# Patient Record
Sex: Male | Born: 1945
Health system: Southern US, Community
[De-identification: ages and names within clinical notes are randomized; demographics above are authoritative.]

## PROBLEM LIST (undated history)

## (undated) DIAGNOSIS — D126 Benign neoplasm of colon, unspecified: Secondary | ICD-10-CM

## (undated) DIAGNOSIS — Z8042 Family history of malignant neoplasm of prostate: Secondary | ICD-10-CM

## (undated) DIAGNOSIS — J339 Nasal polyp, unspecified: Secondary | ICD-10-CM

## (undated) DIAGNOSIS — E039 Hypothyroidism, unspecified: Secondary | ICD-10-CM

## (undated) DIAGNOSIS — J309 Allergic rhinitis, unspecified: Secondary | ICD-10-CM

## (undated) DIAGNOSIS — R030 Elevated blood-pressure reading, without diagnosis of hypertension: Secondary | ICD-10-CM

## (undated) DIAGNOSIS — J4540 Moderate persistent asthma, uncomplicated: Secondary | ICD-10-CM

## (undated) DIAGNOSIS — N400 Enlarged prostate without lower urinary tract symptoms: Secondary | ICD-10-CM

## (undated) HISTORY — DX: Hypothyroidism, unspecified: E03.9

## (undated) HISTORY — PX: TONSILLECTOMY: SHX5217

## (undated) HISTORY — PX: COLONOSCOPY W/ POLYPECTOMY: SHX1380

## (undated) HISTORY — DX: Allergic rhinitis, unspecified: J30.9

## (undated) HISTORY — DX: Moderate persistent asthma, uncomplicated: J45.40

## (undated) HISTORY — DX: Elevated blood-pressure reading, without diagnosis of hypertension: R03.0

## (undated) HISTORY — DX: Benign neoplasm of colon, unspecified: D12.6

## (undated) HISTORY — DX: Family history of malignant neoplasm of prostate: Z80.42

## (undated) HISTORY — DX: Nasal polyp, unspecified: J33.9

## (undated) HISTORY — DX: Benign prostatic hyperplasia without lower urinary tract symptoms: N40.0

---

## 2006-07-18 ENCOUNTER — Ambulatory Visit: Payer: Self-pay | Admitting: Internal Medicine

## 2006-07-18 LAB — CONVERTED CEMR LAB
Albumin: 4.1 g/dL (ref 3.5–5.2)
Alkaline Phosphatase: 65 units/L (ref 39–117)
BUN: 20 mg/dL (ref 6–23)
Basophils Absolute: 0 10*3/uL (ref 0.0–0.1)
CO2: 29 meq/L (ref 19–32)
Creatinine, Ser: 0.9 mg/dL (ref 0.4–1.5)
GFR calc non Af Amer: 91 mL/min
Glomerular Filtration Rate, Af Am: 110 mL/min/{1.73_m2}
Glucose, Bld: 88 mg/dL (ref 70–99)
HDL: 76.9 mg/dL (ref >39.0)
Hemoglobin: 15.3 g/dL (ref 13.0–17.0)
LDL DIRECT: 65.1 mg/dL
MCHC: 35.8 g/dL (ref 30.0–36.0)
Monocytes Relative: 7.5 % (ref 3.0–11.0)
Neutro Abs: 3.4 10*3/uL (ref 1.4–7.7)
Neutrophils Relative %: 45.3 % (ref 43.0–77.0)
PSA: 0.74 ng/mL (ref 0.10–4.00)
Platelets: 339 10*3/uL (ref 150–400)
Potassium: 3.5 meq/L (ref 3.5–5.1)
RBC: 4.55 M/uL (ref 4.22–5.81)
RDW: 12.6 % (ref 11.5–14.6)
Sodium: 140 meq/L (ref 135–145)
Total Bilirubin: 1.1 mg/dL (ref 0.3–1.2)
Total Protein: 6.9 g/dL (ref 6.0–8.3)
Triglyceride fasting, serum: 182 mg/dL — ABNORMAL HIGH (ref 0–149)
VLDL: 36 mg/dL (ref 0–40)

## 2006-07-25 ENCOUNTER — Ambulatory Visit: Payer: Self-pay | Admitting: Internal Medicine

## 2006-08-21 ENCOUNTER — Ambulatory Visit: Payer: Self-pay | Admitting: Internal Medicine

## 2006-08-21 LAB — CONVERTED CEMR LAB
Triglycerides: 57 mg/dL (ref 0–149)
VLDL: 11 mg/dL (ref 0–40)

## 2006-09-05 ENCOUNTER — Ambulatory Visit: Payer: Self-pay | Admitting: Internal Medicine

## 2007-03-12 DIAGNOSIS — J309 Allergic rhinitis, unspecified: Secondary | ICD-10-CM | POA: Insufficient documentation

## 2007-03-12 DIAGNOSIS — J45909 Unspecified asthma, uncomplicated: Secondary | ICD-10-CM | POA: Insufficient documentation

## 2007-06-18 ENCOUNTER — Encounter: Payer: Self-pay | Admitting: Internal Medicine

## 2007-08-11 ENCOUNTER — Encounter: Payer: Self-pay | Admitting: Internal Medicine

## 2008-01-13 ENCOUNTER — Ambulatory Visit: Payer: Self-pay | Admitting: Internal Medicine

## 2008-01-13 DIAGNOSIS — E039 Hypothyroidism, unspecified: Secondary | ICD-10-CM

## 2008-01-13 LAB — CONVERTED CEMR LAB
Free T4: 1.4 ng/dL (ref 0.6–1.6)
T3, Free: 3.7 pg/mL (ref 2.3–4.2)

## 2008-03-01 ENCOUNTER — Ambulatory Visit: Payer: Self-pay | Admitting: Gastroenterology

## 2008-03-24 ENCOUNTER — Encounter: Payer: Self-pay | Admitting: Internal Medicine

## 2008-04-07 ENCOUNTER — Ambulatory Visit: Payer: Self-pay | Admitting: Gastroenterology

## 2008-04-07 ENCOUNTER — Encounter: Payer: Self-pay | Admitting: Gastroenterology

## 2008-04-12 ENCOUNTER — Encounter: Payer: Self-pay | Admitting: Gastroenterology

## 2008-06-23 ENCOUNTER — Encounter: Payer: Self-pay | Admitting: Internal Medicine

## 2009-02-24 ENCOUNTER — Emergency Department (HOSPITAL_COMMUNITY): Admission: EM | Admit: 2009-02-24 | Discharge: 2009-02-24 | Payer: Self-pay | Admitting: Emergency Medicine

## 2009-03-04 ENCOUNTER — Encounter (INDEPENDENT_AMBULATORY_CARE_PROVIDER_SITE_OTHER): Payer: Self-pay | Admitting: *Deleted

## 2009-03-09 ENCOUNTER — Ambulatory Visit: Payer: Self-pay | Admitting: Internal Medicine

## 2009-03-09 LAB — CONVERTED CEMR LAB
ALT: 11 units/L (ref 0–53)
AST: 13 units/L (ref 0–37)
Albumin: 3.7 g/dL (ref 3.5–5.2)
Bilirubin Urine: NEGATIVE
Chloride: 105 meq/L (ref 96–112)
Eosinophils Relative: 5.1 % — ABNORMAL HIGH (ref 0.0–5.0)
GFR calc non Af Amer: 103.04 mL/min (ref >60)
Glucose, Bld: 83 mg/dL (ref 70–99)
Glucose, Urine, Semiquant: NEGATIVE
HCT: 41.5 % (ref 39.0–52.0)
Hemoglobin: 14.4 g/dL (ref 13.0–17.0)
LDL Cholesterol: 74 mg/dL (ref 0–99)
Lymphs Abs: 3 10*3/uL (ref 0.7–4.0)
MCV: 93.6 fL (ref 78.0–100.0)
Monocytes Absolute: 0.4 10*3/uL (ref 0.1–1.0)
Monocytes Relative: 6.5 % (ref 3.0–12.0)
Neutro Abs: 2.9 10*3/uL (ref 1.4–7.7)
Potassium: 4 meq/L (ref 3.5–5.1)
Protein, U semiquant: NEGATIVE
Sodium: 140 meq/L (ref 135–145)
TSH: 0.03 microintl units/mL — ABNORMAL LOW (ref 0.35–5.50)
Total Protein: 6.7 g/dL (ref 6.0–8.3)
VLDL: 28.8 mg/dL (ref 0.0–40.0)
WBC Urine, dipstick: NEGATIVE
WBC: 6.7 10*3/uL (ref 4.5–10.5)
pH: 7

## 2009-04-01 ENCOUNTER — Ambulatory Visit: Payer: Self-pay | Admitting: Internal Medicine

## 2009-04-01 DIAGNOSIS — N63 Unspecified lump in unspecified breast: Secondary | ICD-10-CM

## 2009-04-06 ENCOUNTER — Encounter: Payer: Self-pay | Admitting: Internal Medicine

## 2009-07-27 ENCOUNTER — Telehealth: Payer: Self-pay | Admitting: Gastroenterology

## 2009-09-15 ENCOUNTER — Encounter (INDEPENDENT_AMBULATORY_CARE_PROVIDER_SITE_OTHER): Payer: Self-pay | Admitting: *Deleted

## 2009-09-16 ENCOUNTER — Encounter: Payer: Self-pay | Admitting: Internal Medicine

## 2009-09-21 ENCOUNTER — Ambulatory Visit: Payer: Self-pay | Admitting: Gastroenterology

## 2009-10-04 ENCOUNTER — Ambulatory Visit: Payer: Self-pay | Admitting: Gastroenterology

## 2009-10-07 ENCOUNTER — Encounter: Payer: Self-pay | Admitting: Gastroenterology

## 2010-02-21 IMAGING — CT CT HEAD W/O CM
2 series · 16 of 30 positions shown, 20 images · non-contrast
Comparison: None

CLINICAL DATA: Weakness, altered level of consciousness

CT HEAD WITHOUT CONTRAST
TECHNIQUE: Contiguous axial images were obtained from the base of
the skull through the vertex without contrast.

[Series 2: brain · axial · 0.47mm/px · z∈[+158,+284]mm · 13 of 44 slices shown, 17 images]
[im 4/44  brain]
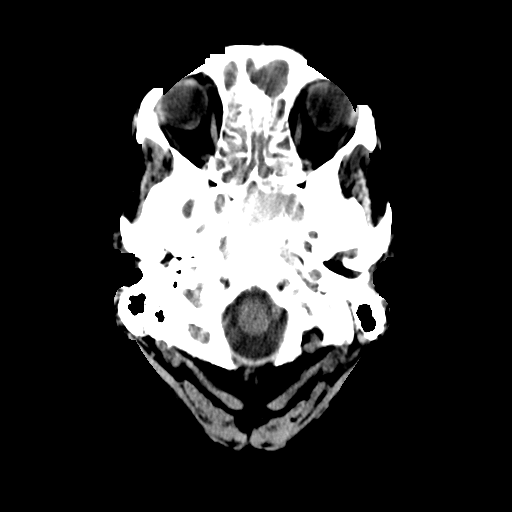
[im 4/44  bone]
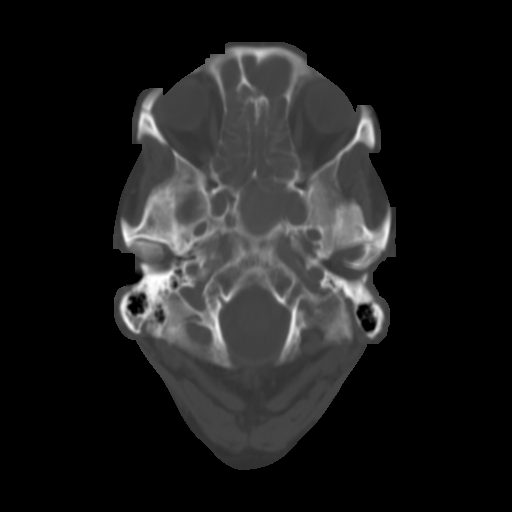
[im 7/44  brain]
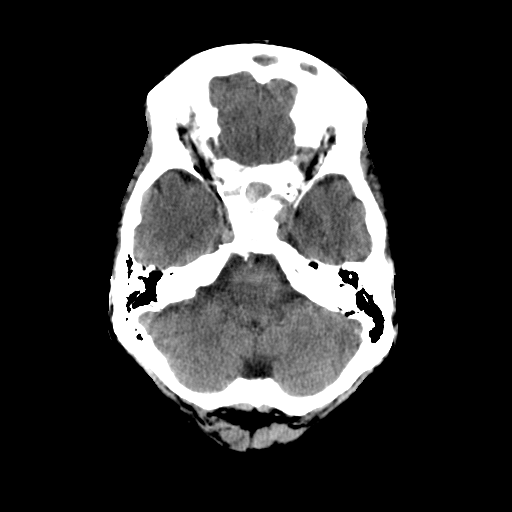
[im 10/44  brain]
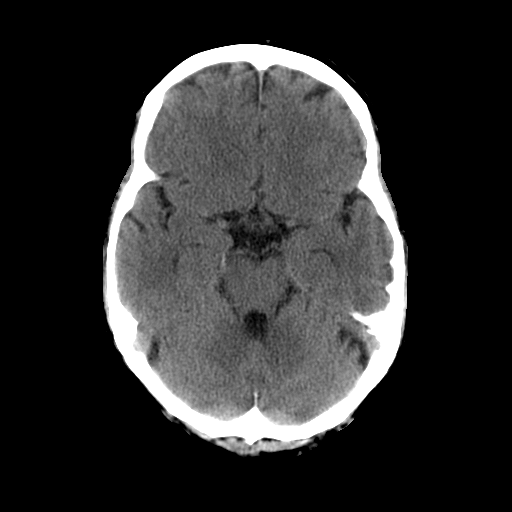
[im 13/44  brain]
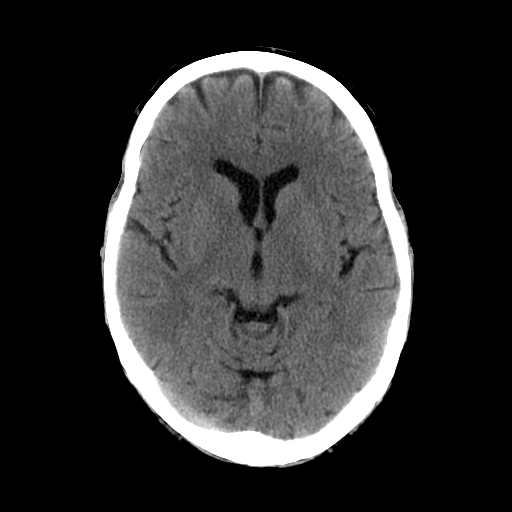
[im 16/44  brain]
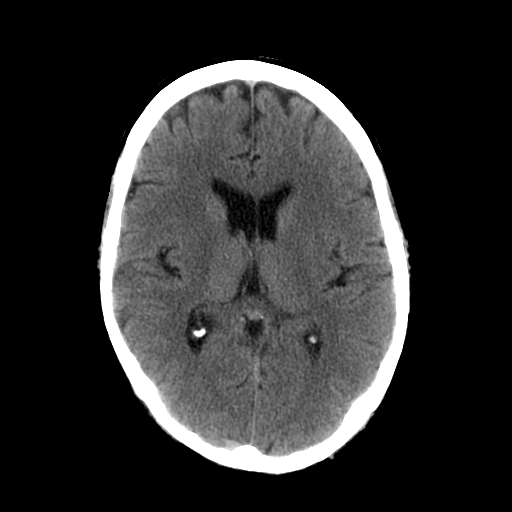
[im 16/44  bone]
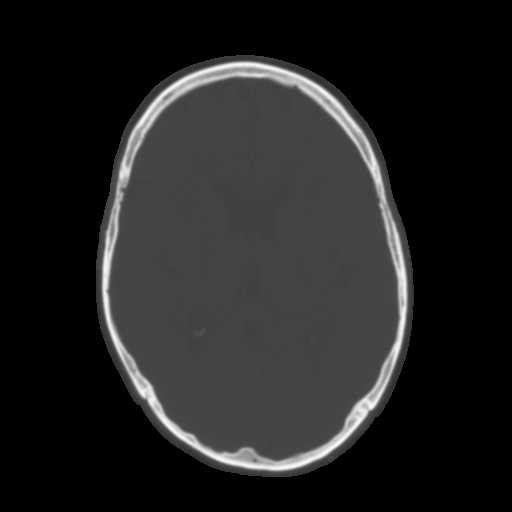
[im 19/44  brain]
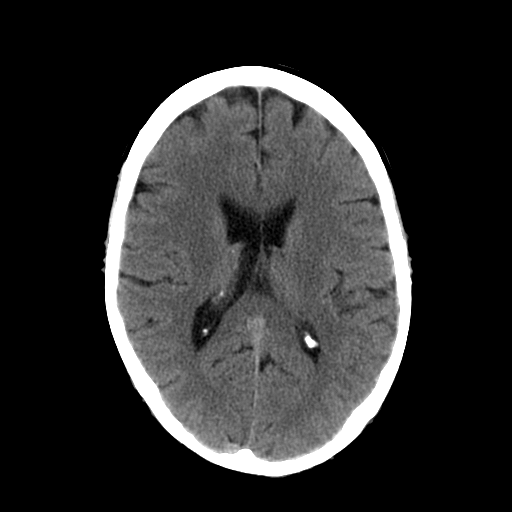
[im 22/44  brain]
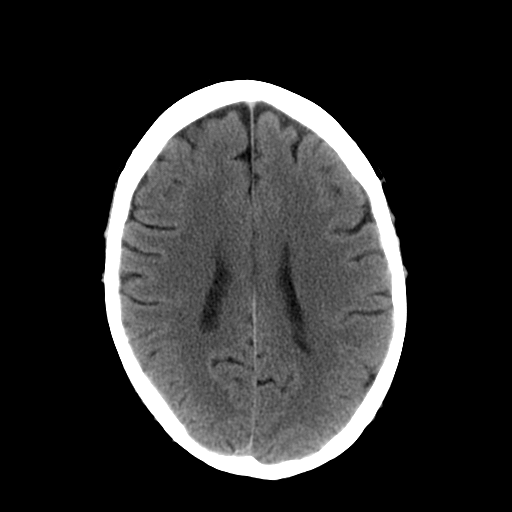
[im 25/44  brain]
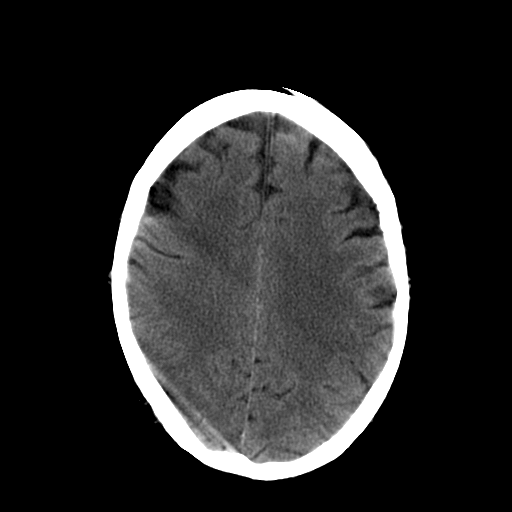
[im 28/44  brain]
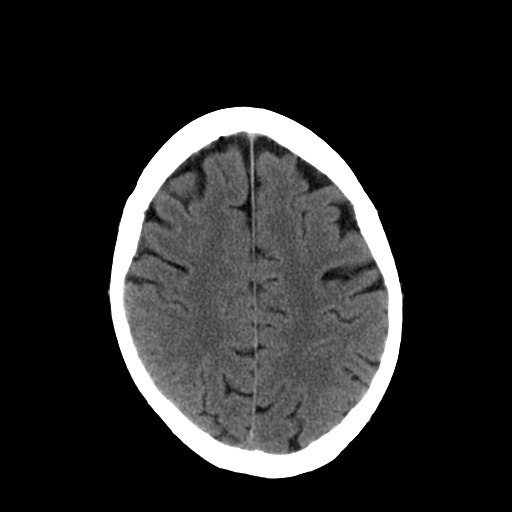
[im 28/44  bone]
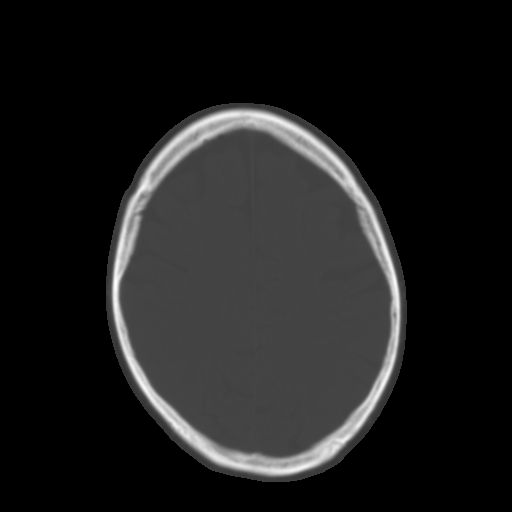
[im 31/44  brain]
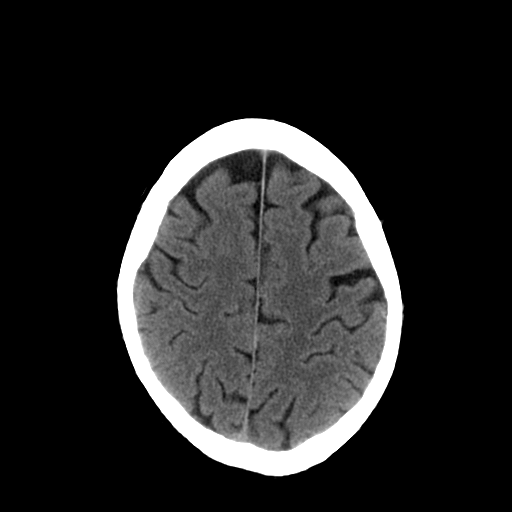
[im 34/44  brain]
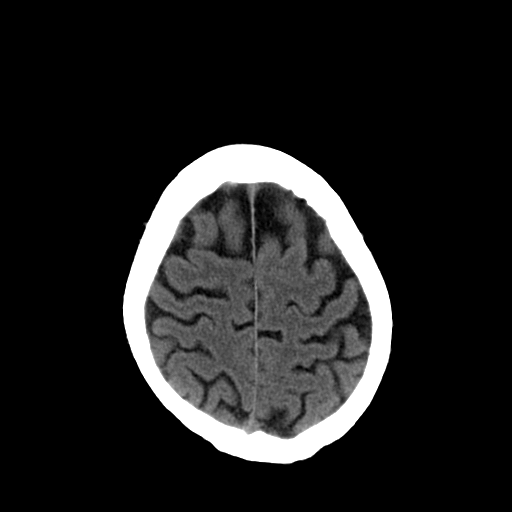
[im 37/44  brain]
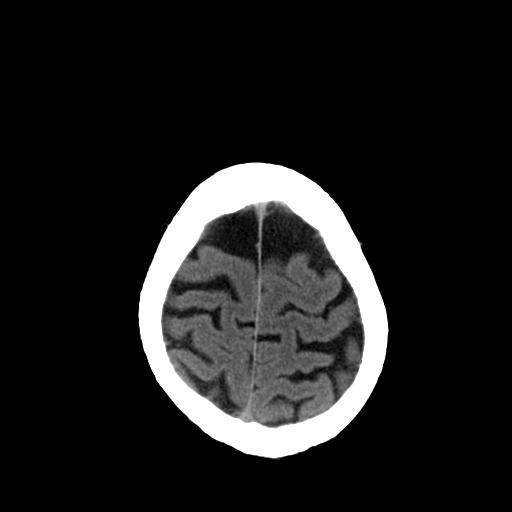
[im 40/44  brain]
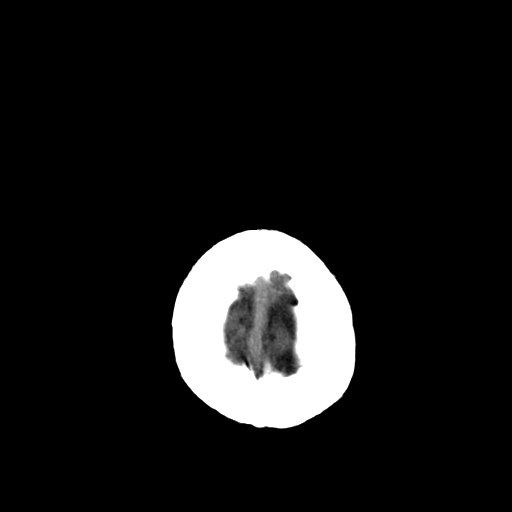
[im 40/44  bone]
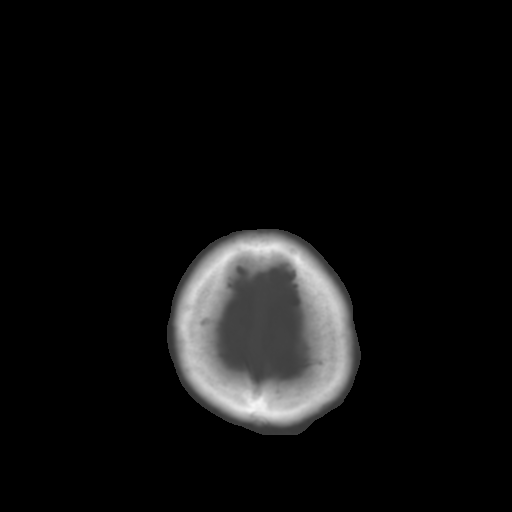

[Series 3: recon 2: brain · axial · 0.47mm/px · z∈[+158,+216]mm · 3 of 44 slices shown]
[im 4/44  brain]
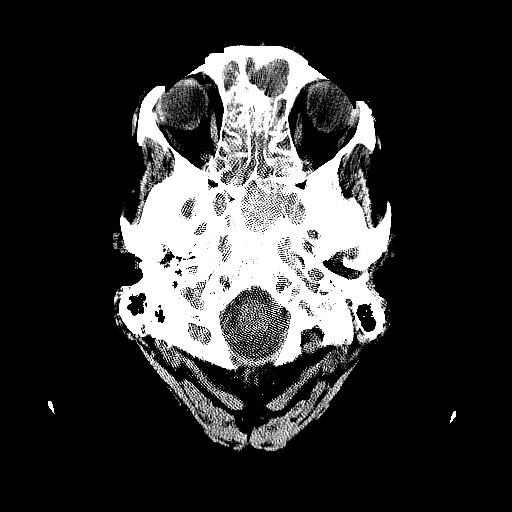
[im 10/44  brain]
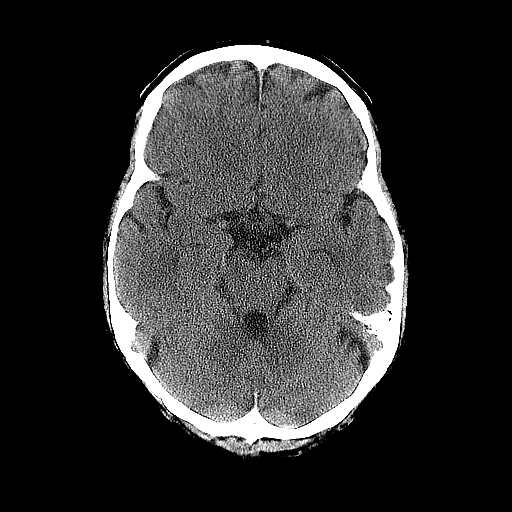
[im 16/44  brain]
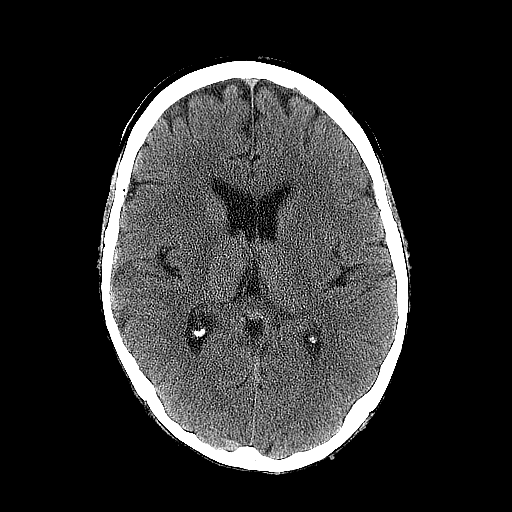

[16 of 30 positions shown; findings below may reference images not displayed]

FINDINGS: Motion artifact is noted. No acute hemorrhage, acute
infarction, or mass lesion is identified.  No midline shift.  No
ventriculomegaly.  Dense opacification of the frontal, ethmoid, and
sphenoid sinuses is noted.  Partial left maxillary sinus
opacification noted.  Orbits are intact.  No skull fracture.
IMPRESSION: No acute intracranial finding.

Sinusitis.

## 2010-08-15 NOTE — Miscellaneous (Signed)
Summary: [facility]  Clinical Lists Changes  Medications: Added new medication of MOVIPREP 100 GM  SOLR (PEG-KCL-NACL-NASULF-NA ASC-C) As per prep instructions. - Signed Rx of MOVIPREP 100 GM  SOLR (PEG-KCL-NACL-NASULF-NA ASC-C) As per prep instructions.;  #1 x 0;  Signed;  Entered by: Ezra Sites RN;  Authorized by: Mardella Layman MD [facility];  Method used: Electronically to [facility]*, [address] [place], [place]  [address], Ph: [phone] or [phone], Fax: [phone] Observations: Added new observation of ALLERGY REV: Done (09/21/2009 13:18)    Prescriptions: MOVIPREP 100 GM  SOLR (PEG-KCL-NACL-NASULF-NA ASC-C) As per prep instructions.  #1 x 0   Entered by:   Ezra Sites RN   Authorized by:   Mardella Layman MD [facility]   Signed by:   Ezra Sites RN on 09/21/2009   Method used:   Electronically to        [facility]* (retail)       [address]       [place], [place]  [address]       Ph: [phone] or [phone]       Fax: [phone]   RxID:   [id number]

## 2010-08-15 NOTE — Letter (Signed)
Summary: Moviprep Instructions  [facility]  [address]   [place], [place] [address]   Phone: [phone]  Fax: [phone]       Benjamin Johns    September 21, 1945    MRN: [id number]        Procedure Day /Date: Tuesday 10-04-09     Arrival Time: 12:30 p.m.     Procedure Time: 1:30 p.m.     Location of Procedure:                    x   [facility] (4th Floor)   PREPARATION FOR COLONOSCOPY WITH MOVIPREP   Starting 5 days prior to your procedure  Thursday, 09-29-09 do not eat nuts, seeds, popcorn, corn, beans, peas,  salads, or any raw vegetables.  Do not take any fiber supplements (e.g. Metamucil, Citrucel, and Benefiber).  THE DAY BEFORE YOUR PROCEDURE         DATE: 10-03-09   DAY: Monday  1.  Drink clear liquids the entire day-NO SOLID FOOD  2.  Do not drink anything colored red or purple.  Avoid juices with pulp.  No orange juice.  3.  Drink at least 64 oz. (8 glasses) of fluid/clear liquids during the day to prevent dehydration and help the prep work efficiently.  CLEAR LIQUIDS INCLUDE: Water Jello Ice Popsicles Tea (sugar ok, no milk/cream) Powdered fruit flavored drinks Coffee (sugar ok, no milk/cream) Gatorade Juice: apple, white grape, white cranberry  Lemonade Clear bullion, consomm, broth Carbonated beverages (any kind) Strained chicken noodle soup Hard Candy                             4.  In the morning, mix first dose of MoviPrep solution:    Empty 1 Pouch A and 1 Pouch B into the disposable container    Add lukewarm drinking water to the top line of the container. Mix to dissolve    Refrigerate (mixed solution should be used within 24 hrs)  5.  Begin drinking the prep at 5:00 p.m. The MoviPrep container is divided by 4 marks.   Every 15 minutes drink the solution down to the next mark (approximately 8 oz) until the full liter is complete.   6.  Follow completed prep with 16 oz of clear liquid of your choice (Nothing red or  purple).  Continue to drink clear liquids until bedtime.  7.  Before going to bed, mix second dose of MoviPrep solution:    Empty 1 Pouch A and 1 Pouch B into the disposable container    Add lukewarm drinking water to the top line of the container. Mix to dissolve    Refrigerate  THE DAY OF YOUR PROCEDURE      DATE: 10-04-09   DAY: Tuesday  Beginning at 8:30 a.m. (5 hours before procedure):         1. Every 15 minutes, drink the solution down to the next mark (approx 8 oz) until the full liter is complete.  2. Follow completed prep with 16 oz. of clear liquid of your choice.    3. You may drink clear liquids until  11:30 a.m. (2 HOURS BEFORE PROCEDURE).   MEDICATION INSTRUCTIONS  Unless otherwise instructed, you should take regular prescription medications with a small sip of water   as early as possible the morning of your procedure.           OTHER INSTRUCTIONS  You will need a responsible adult at least 67 years of age to accompany you and drive you home.   This person must remain in the waiting room during your procedure.  Wear loose fitting clothing that is easily removed.  Leave jewelry and other valuables at home.  However, you may wish to bring a book to read or  an iPod/MP3 player to listen to music as you wait for your procedure to start.  Remove all body piercing jewelry and leave at home.  Total time from sign-in until discharge is approximately 2-3 hours.  You should go home directly after your procedure and rest.  You can resume normal activities the  day after your procedure.  The day of your procedure you should not:   Drive   Make legal decisions   Operate machinery   Drink alcohol   Return to work  You will receive specific instructions about eating, activities and medications before you leave.    The above instructions have been reviewed and explained to me by  Ezra Sites RN  September 21, 2009 1:38 PM      I fully understand and  can verbalize these instructions _____________________________ Date _________

## 2010-08-15 NOTE — Letter (Signed)
Summary: Patient Notice- Polyp Results  [facility]  [address] [place], [place] [address]   Phone: [phone]  Fax: [phone]        October 07, 2009 MRN: [id number]    WILMOT QUEVEDO [address] [place], [place]  [address]    Dear Mr. FERRON,  I am pleased to inform you that the colon polyp(s) removed during your recent colonoscopy was (were) found to be benign (no cancer detected) upon pathologic examination.  I recommend you have a repeat colonoscopy examination in 3_ years to look for recurrent polyps, as having colon polyps increases your risk for having recurrent polyps or even colon cancer in the future.  Should you develop new or worsening symptoms of abdominal pain, bowel habit changes or bleeding from the rectum or bowels, please schedule an evaluation with either your primary care physician or with me.  Additional information/recommendations:  _xx_ No further action with gastroenterology is needed at this time. Please      follow-up with your primary care physician for your other healthcare      needs.  __ Please call [phone] to schedule a return visit to review your      situation.  __ Please keep your follow-up visit as already scheduled.  __ Continue treatment plan as outlined the day of your exam.  Please call us if you are having persistent problems or have questions about your condition that have not been fully answered at this time.  Sincerely,  Mardella Layman MD [facility]  This letter has been electronically signed by your physician.  Appended Document: Patient Notice- Polyp Results letter mailed 3.28.11

## 2010-08-15 NOTE — Progress Notes (Signed)
Summary: Schedule Colonoscopy  Phone Note Outgoing Call Call back at [facility] Phone [phone]   Call placed by: Harlow Mares CMA Duncan Dull),  July 27, 2009 8:42 AM Call placed to: Patient Summary of Call: Left message on patients machine to call back.  Initial call taken by: Harlow Mares CMA Duncan Dull),  July 27, 2009 8:43 AM  Follow-up for Phone Call        patietn will call back to schedule his colonoscopy while he in the states the next couple of weeks.  Follow-up by: Harlow Mares CMA Duncan Dull),  August 10, 2009 9:08 AM

## 2010-08-15 NOTE — Procedures (Signed)
Summary: Colonoscopy  Patient: Benjamin Johns Note: All result statuses are Final unless otherwise noted.  Tests: (1) Colonoscopy (COL)   COL Colonoscopy           DONE     [facility]     [address]     [place], [place]  [address]           COLONOSCOPY PROCEDURE REPORT           PATIENT:  [name], [name]  MR#:  [id number]     BIRTHDATE:  13-Jun-1946, 63 yrs. old  GENDER:  male           ENDOSCOPIST:  Vania Rea. Jarold Motto, MD, [facility]     Referred by:           PROCEDURE DATE:  10/04/2009     PROCEDURE:  Colonoscopy with snare polypectomy     ASA CLASS:  Class II     INDICATIONS:  history of pre-cancerous (adenomatous) colon polyps                 MEDICATIONS:   Fentanyl 50 mcg IV, Versed 6 mg IV           DESCRIPTION OF PROCEDURE:   After the risks benefits and     alternatives of the procedure were thoroughly explained, informed     consent was obtained.  Digital rectal exam was performed and     revealed no abnormalities.   The LB CF-[id number] endoscope     was introduced through the anus and advanced to the cecum, which     was identified by both the appendix and ileocecal valve, without     limitations.  The quality of the prep was excellent, using     MoviPrep.  The instrument was then slowly withdrawn as the colon     was fully examined.     <<PROCEDUREIMAGES>>           FINDINGS:  Scattered diverticula were found in the ascending     colon.  There were multiple polyps identified and removed. found     scattered throught the colon. 3-5MM FLAT POLYPS HOT SNARE     EXCISED.LEFT AND RIGHT COLON.  This was otherwise a normal     examination of the colon.   Retroflexed views in the rectum     revealed no abnormalities.    The scope was then withdrawn from     the patient and the procedure completed.           COMPLICATIONS:  None           ENDOSCOPIC IMPRESSION:     1) Diverticula, scattered in the ascending colon     2) Polyps, multiple found scattered  throught the colon     3) Otherwise normal examination     RECOMMENDATIONS:     1) If the polyp(s) removed today are proven to be adenomatous     (pre-cancerous) polyps, you will need a colonoscopy in 3 years.     Otherwise you should continue to follow colorectal cancer     screening guidelines for "routine risk" patients with a     colonoscopy in 10 years.           REPEAT EXAM:  No           ______________________________     Vania Rea. Jarold Motto, MD, [facility]           CC:  Balinda Quails  Lovell Sheehan, MD           n.     Rosalie DoctorVania Rea. Patterson at 10/04/2009 02:11 PM           Katina Dung, [phone]  Note: An exclamation mark (!) indicates a result that was not dispersed into the flowsheet. Document Creation Date: 10/04/2009 2:11 PM _______________________________________________________________________  (1) Order result status: Final Collection or observation date-time: 10/04/2009 14:05 Requested date-time:  Receipt date-time:  Reported date-time:  Referring Physician:   Ordering Physician: Sheryn Bison [phone]) Specimen Source:  Source: Launa Grill Order Number: [id number] Lab site:   Appended Document: Colonoscopy 3y f/u

## 2010-08-15 NOTE — Letter (Signed)
Summary: [facility]  [facility]   Imported By: Maryln Gottron 10/13/2009 12:28:24  _____________________________________________________________________  External Attachment:    Type:   Image     Comment:   External Document

## 2010-10-22 LAB — BASIC METABOLIC PANEL
BUN: 21 mg/dL (ref 6–23)
Calcium: 8.8 mg/dL (ref 8.4–10.5)
Creatinine, Ser: 0.81 mg/dL (ref 0.4–1.5)
GFR calc non Af Amer: >60 mL/min (ref >60)
Glucose, Bld: 138 mg/dL — ABNORMAL HIGH (ref 70–99)
Potassium: 3.7 mEq/L (ref 3.5–5.1)

## 2010-10-22 LAB — PROTIME-INR: Prothrombin Time: 11.8 seconds (ref 11.6–15.2)

## 2010-10-22 LAB — CBC
HCT: 40.3 % (ref 39.0–52.0)
Platelets: 260 10*3/uL (ref 150–400)
RDW: 13 % (ref 11.5–15.5)

## 2010-10-22 LAB — DIFFERENTIAL
Basophils Absolute: 0 10*3/uL (ref 0.0–0.1)
Eosinophils Relative: 5 % (ref 0–5)
Lymphocytes Relative: 45 % (ref 12–46)
Neutrophils Relative %: 45 % (ref 43–77)

## 2011-06-28 ENCOUNTER — Other Ambulatory Visit: Payer: Self-pay | Admitting: Internal Medicine

## 2011-06-28 MED ORDER — LEVOTHYROXINE SODIUM 125 MCG PO TABS
125.0000 ug | ORAL_TABLET | Freq: Every day | ORAL | Status: DC
Start: 2011-06-28 — End: 2011-09-12

## 2011-06-28 NOTE — Telephone Encounter (Signed)
SYNTHROID 125 MCG  TABS (LEVOTHYROXINE SODIUM) Take 1 tablet by mouth once a day 3 month supply to [facility] in [place] [phone].  Pt has sch and ov for cpx in Feb 2013.

## 2011-09-12 ENCOUNTER — Encounter: Payer: Self-pay | Admitting: Internal Medicine

## 2011-09-12 ENCOUNTER — Ambulatory Visit (INDEPENDENT_AMBULATORY_CARE_PROVIDER_SITE_OTHER): Payer: Medicare Other | Admitting: Internal Medicine

## 2011-09-12 DIAGNOSIS — Z0000 Encounter for general adult medical examination without abnormal findings: Secondary | ICD-10-CM | POA: Diagnosis not present

## 2011-09-12 DIAGNOSIS — E785 Hyperlipidemia, unspecified: Secondary | ICD-10-CM | POA: Diagnosis not present

## 2011-09-12 DIAGNOSIS — E039 Hypothyroidism, unspecified: Secondary | ICD-10-CM

## 2011-09-12 DIAGNOSIS — N400 Enlarged prostate without lower urinary tract symptoms: Secondary | ICD-10-CM | POA: Diagnosis not present

## 2011-09-12 DIAGNOSIS — N138 Other obstructive and reflux uropathy: Secondary | ICD-10-CM

## 2011-09-12 DIAGNOSIS — N401 Enlarged prostate with lower urinary tract symptoms: Secondary | ICD-10-CM

## 2011-09-12 DIAGNOSIS — T887XXA Unspecified adverse effect of drug or medicament, initial encounter: Secondary | ICD-10-CM | POA: Diagnosis not present

## 2011-09-12 DIAGNOSIS — Z8042 Family history of malignant neoplasm of prostate: Secondary | ICD-10-CM

## 2011-09-12 DIAGNOSIS — B351 Tinea unguium: Secondary | ICD-10-CM

## 2011-09-12 LAB — HEPATIC FUNCTION PANEL
ALT: 17 U/L (ref 0–53)
Total Bilirubin: 1.2 mg/dL (ref 0.3–1.2)
Total Protein: 6.8 g/dL (ref 6.0–8.3)

## 2011-09-12 LAB — POCT URINALYSIS DIPSTICK
Leukocytes, UA: NEGATIVE
Nitrite, UA: NEGATIVE
Protein, UA: NEGATIVE
pH, UA: 6

## 2011-09-12 LAB — CBC WITH DIFFERENTIAL/PLATELET
Basophils Absolute: 0.1 10*3/uL (ref 0.0–0.1)
Eosinophils Absolute: 0.1 10*3/uL (ref 0.0–0.7)
HCT: 43.9 % (ref 39.0–52.0)
Lymphs Abs: 3.4 10*3/uL (ref 0.7–4.0)
MCV: 93.9 fl (ref 78.0–100.0)
Monocytes Absolute: 0.7 10*3/uL (ref 0.1–1.0)
Platelets: 331 10*3/uL (ref 150.0–400.0)
RDW: 13.8 % (ref 11.5–14.6)

## 2011-09-12 LAB — TSH: TSH: 0.09 u[IU]/mL — ABNORMAL LOW (ref 0.35–5.50)

## 2011-09-12 LAB — LIPID PANEL
Cholesterol: 206 mg/dL — ABNORMAL HIGH (ref 0–200)
Total CHOL/HDL Ratio: 3
Triglycerides: 174 mg/dL — ABNORMAL HIGH (ref 0.0–149.0)

## 2011-09-12 LAB — BASIC METABOLIC PANEL
Calcium: 9.8 mg/dL (ref 8.4–10.5)
Creatinine, Ser: 0.9 mg/dL (ref 0.4–1.5)

## 2011-09-12 LAB — PSA: PSA: 0.93 ng/mL (ref 0.10–4.00)

## 2011-09-12 MED ORDER — LEVOTHYROXINE SODIUM 125 MCG PO TABS: 125.0000 ug | ORAL_TABLET | Freq: Every day | ORAL | Status: DC

## 2011-09-12 MED ORDER — CIPROFLOXACIN HCL 500 MG PO TABS: 500.0000 mg | ORAL_TABLET | Freq: Two times a day (BID) | ORAL | Status: AC

## 2011-09-12 MED ORDER — TERBINAFINE HCL 250 MG PO TABS: 250.0000 mg | ORAL_TABLET | Freq: Every day | ORAL | Status: DC

## 2011-09-12 NOTE — Patient Instructions (Addendum)
The patient is instructed to continue all medications as prescribed. Schedule followup with check out clerk upon leaving the clinic One cup of white vinegar and 1 gallon of warm water soak for 15 minutes 2-3 times a week

## 2011-09-12 NOTE — Progress Notes (Signed)
Subjective:    Patient ID: Benjamin Johns, male    DOB: [birth year], 66 y.o.   MRN: [id number]  HPI Patient presents for a Medicare wellness examination but also presents to follow up for chronic medical problems of hypothyroidism asthma and allergic rhinitis. He has an acute problem of increased nocturia and a sense of urgency in that when he has to go to the restroom if he does not void soon he feels that he would be incontinent   Review of Systems  Constitutional: Negative for fever and fatigue.  HENT: Negative for hearing loss, congestion, neck pain and postnasal drip.   Eyes: Negative for discharge, redness and visual disturbance.  Respiratory: Negative for cough, shortness of breath and wheezing.   Cardiovascular: Negative for leg swelling.  Gastrointestinal: Negative for abdominal pain, constipation and abdominal distention.  Genitourinary: Negative for urgency and frequency.  Musculoskeletal: Negative for joint swelling and arthralgias.  Skin: Negative for color change and rash.  Neurological: Negative for weakness and light-headedness.  Hematological: Negative for adenopathy.  Psychiatric/Behavioral: Negative for behavioral problems.       Objective:   Physical Exam  Constitutional: He is oriented to person, place, and time. He appears well-developed and well-nourished.  HENT:  Head: Normocephalic and atraumatic.  Eyes: Conjunctivae are normal. Pupils are equal, round, and reactive to light.  Neck: Normal range of motion. Neck supple.  Cardiovascular: Normal rate and regular rhythm.   Pulmonary/Chest: Effort normal and breath sounds normal.  Abdominal: Soft. Bowel sounds are normal.  Genitourinary: Rectum normal and prostate normal.  Neurological: He is alert and oriented to person, place, and time.  Skin: Skin is warm and dry.   enlarged prostate 2+ enlarged        Assessment & Plan:  Discussed benign prostatic hypertrophy and will treat him with an antibiotic twice  daily for 2 weeks.  If his symptoms do not resolve will refer to urology for further evaluation.  I suspect his PSA would be slightly elevated due to the inflammation will monitor her PSA today.  Otherwise his blood pressure is stable at TSH T3-T4 free monitor today for his hypothyroidism his asthma is stable as well as his allergic rhinitis Subjective:    Benjamin Johns is a 66 y.o. male who presents for Medicare Initial preventive examination.   Preventive Screening-Counseling & Management  Tobacco History  Smoking status  . Never Smoker   Smokeless tobacco  . Not on file    Problems Prior to Visit 1.   Current Problems (verified) Patient Active Problem List  Diagnoses  . HYPOTHYROIDISM  . ALLERGIC RHINITIS  . ASTHMA  . BREAST MASS, RIGHT    Medications Prior to Visit No current outpatient prescriptions on file prior to visit.    Current Medications (verified) Current Outpatient Prescriptions  Medication Sig Dispense Refill  . levothyroxine (SYNTHROID) 125 MCG tablet Take 1 tablet (125 mcg total) by mouth daily.  90 tablet  3  . ciprofloxacin (CIPRO) 500 MG tablet Take 1 tablet (500 mg total) by mouth 2 (two) times daily.  28 tablet  0  . terbinafine (LAMISIL) 250 MG tablet Take 1 tablet (250 mg total) by mouth daily.  90 tablet  0     Allergies (verified) JXB:JYNWGNFAOZH+YQMVHQION+GEXBMWUXLK acid+aspartame   PAST HISTORY  Family History Family History  Problem Relation Age of Onset  . Dementia Mother   . Prostate cancer Father   . Hypothyroidism Brother     Social History History  Substance  Use Topics  . Smoking status: Never Smoker   . Smokeless tobacco: Not on file  . Alcohol Use: Yes    Are there smokers in your home (other than you)?  No  Risk Factors Current exercise habits: Gym/ health club routine includes cardio, light weights and treadmill.  Dietary issues discussed:    Cardiac risk factors: advanced age (older than 13 for men, 11 for  women) and male gender.  Depression Screen (Note: if answer to either of the following is "Yes", a more complete depression screening is indicated)   Q1: Over the past two weeks, have you felt down, depressed or hopeless? No  Q2: Over the past two weeks, have you felt little interest or pleasure in doing things? No  Have you lost interest or pleasure in daily life? No  Do you often feel hopeless? No  Do you cry easily over simple problems? No  Activities of Daily Living In your present state of health, do you have any difficulty performing the following activities?:  Driving? No Managing money?  No Feeding yourself? No Getting from bed to chair? No Climbing a flight of stairs? No Preparing food and eating?: No Bathing or showering? No Getting dressed: No Getting to the toilet? No Using the toilet:No Moving around from place to place: No In the past year have you fallen or had a near fall?:No   Are you sexually active?  Yes  Do you have more than one partner?  No  Hearing Difficulties: No Do you often ask people to speak up or repeat themselves? No Do you experience ringing or noises in your ears? No Do you have difficulty understanding soft or whispered voices? No   Do you feel that you have a problem with memory? No  Do you often misplace items? No  Do you feel safe at home?  No  Cognitive Testing  Alert? Yes  Normal Appearance?Yes  Oriented to person? Yes  Place? Yes   Time? Yes  Recall of three objects?  Yes  Can perform simple calculations? Yes  Displays appropriate judgment?Yes  Can read the correct time from a watch face?Yes   Advanced Directives have been discussed with the patient? Yes   List the Names of Other Physician/Practitioners you currently use: 1.    Indicate any recent Medical Services you may have received from other than [facility] providers in the past year (date may be approximate).  Immunization History  Administered Date(s) Administered  . Td  07/16/2005    Screening Tests Health Maintenance  Topic Date Due  . Colonoscopy  12/17/1993  . Zostavax  12/18/2003  . Pneumococcal Polysaccharide Vaccine Age 76 And Over  12/17/2008  . Influenza Vaccine  04/16/2011  . Tetanus/tdap  07/17/2015    All answers were reviewed with the patient and necessary referrals were made:  Carrie Mew, MD   09/13/2011   History reviewed: allergies, current medications, past family history, past medical history, past social history, past surgical history and problem list  Review of Systems A comprehensive review of systems was negative.    Objective:     Vision by Snellen chart: right eye:20/20, left eye:20/20 Blood pressure 124/80, pulse 80, temperature 98.3 F (36.8 C), resp. rate 16, height 6\' 2"  (1.88 m), weight 162 lb (73.483 kg). Body mass index is 20.80 kg/(m^2).  BP 124/80  Pulse 80  Temp 98.3 F (36.8 C)  Resp 16  Ht 6\' 2"  (1.88 m)  Wt 162 lb (73.483 kg)  BMI  20.80 kg/m2  General Appearance:    Alert, cooperative, no distress, appears stated age  Head:    Normocephalic, without obvious abnormality, atraumatic  Eyes:    PERRL, conjunctiva/corneas clear, EOM's intact, fundi    benign, both eyes       Ears:    Normal TM's and external ear canals, both ears  Nose:   Nares normal, septum midline, mucosa normal, no drainage    or sinus tenderness  Throat:   Lips, mucosa, and tongue normal; teeth and gums normal  Neck:   Supple, symmetrical, trachea midline, no adenopathy;       thyroid:  No enlargement/tenderness/nodules; no carotid   bruit or JVD  Back:     Symmetric, no curvature, ROM normal, no CVA tenderness  Lungs:     Clear to auscultation bilaterally, respirations unlabored  Chest wall:    No tenderness or deformity  Heart:    Regular rate and rhythm, S1 and S2 normal, no murmur, rub   or gallop  Abdomen:     Soft, non-tender, bowel sounds active all four quadrants,    no masses, no organomegaly  Genitalia:    Normal  male without lesion, discharge or tenderness  Rectal:    Normal tone, normal prostate, no masses or tenderness;   guaiac negative stool  Extremities:   Extremities normal, atraumatic, no cyanosis or edema  Pulses:   2+ and symmetric all extremities  Skin:   Skin color, texture, turgor normal, no rashes or lesions  Lymph nodes:   Cervical, supraclavicular, and axillary nodes normal  Neurologic:   CNII-XII intact. Normal strength, sensation and reflexes      throughout       Assessment:      Patient presents for yearly preventative medicine examination.   all immunizations and health maintenance protocols were reviewed with the patient and they are up to date with these protocols.   screening laboratory values were reviewed with the patient including screening of hyperlipidemia PSA renal function and hepatic function.   There medications past medical history social history problem list and allergies were reviewed in detail.   Goals were established with regard to weight loss exercise diet in compliance with medications      Plan:     During the course of the visit the patient was educated and counseled about appropriate screening and preventive services including:    Influenza vaccine  Td vaccine  Colorectal cancer screening  Diet review for nutrition referral? Yes ____  Not Indicated __x__   Patient Instructions (the written plan) was given to the patient.  Medicare Attestation I have personally reviewed: The patient's medical and social history Their use of alcohol, tobacco or illicit drugs Their current medications and supplements The patient's functional ability including ADLs,fall risks, home safety risks, cognitive, and hearing and visual impairment Diet and physical activities Evidence for depression or mood disorders  The patient's weight, height, BMI, and visual acuity have been recorded in the chart.  I have made referrals, counseling, and provided  education to the patient based on review of the above and I have provided the patient with a written personalized care plan for preventive services.     Carrie Mew, MD   09/13/2011

## 2011-09-17 NOTE — Progress Notes (Signed)
Pt informed an d states he eats a lot of spaghetti

## 2011-11-12 DIAGNOSIS — H6990 Unspecified Eustachian tube disorder, unspecified ear: Secondary | ICD-10-CM | POA: Diagnosis not present

## 2011-11-12 DIAGNOSIS — J330 Polyp of nasal cavity: Secondary | ICD-10-CM | POA: Diagnosis not present

## 2011-11-12 DIAGNOSIS — R05 Cough: Secondary | ICD-10-CM | POA: Diagnosis not present

## 2011-11-12 DIAGNOSIS — H6980 Other specified disorders of Eustachian tube, unspecified ear: Secondary | ICD-10-CM | POA: Diagnosis not present

## 2011-11-12 DIAGNOSIS — J45909 Unspecified asthma, uncomplicated: Secondary | ICD-10-CM | POA: Diagnosis not present

## 2011-11-13 DIAGNOSIS — J45909 Unspecified asthma, uncomplicated: Secondary | ICD-10-CM | POA: Diagnosis not present

## 2011-11-19 DIAGNOSIS — H6990 Unspecified Eustachian tube disorder, unspecified ear: Secondary | ICD-10-CM | POA: Diagnosis not present

## 2011-11-19 DIAGNOSIS — R05 Cough: Secondary | ICD-10-CM | POA: Diagnosis not present

## 2011-11-19 DIAGNOSIS — H6980 Other specified disorders of Eustachian tube, unspecified ear: Secondary | ICD-10-CM | POA: Diagnosis not present

## 2011-11-19 DIAGNOSIS — J45909 Unspecified asthma, uncomplicated: Secondary | ICD-10-CM | POA: Diagnosis not present

## 2012-04-10 ENCOUNTER — Ambulatory Visit (INDEPENDENT_AMBULATORY_CARE_PROVIDER_SITE_OTHER): Payer: Medicare Other | Admitting: Internal Medicine

## 2012-04-10 ENCOUNTER — Encounter: Payer: Self-pay | Admitting: Internal Medicine

## 2012-04-10 VITALS — BP 130/78 | HR 76 | Temp 98.2°F | Resp 16 | Ht 74.0 in | Wt 162.0 lb

## 2012-04-10 DIAGNOSIS — N63 Unspecified lump in unspecified breast: Secondary | ICD-10-CM | POA: Diagnosis not present

## 2012-04-10 DIAGNOSIS — N139 Obstructive and reflux uropathy, unspecified: Secondary | ICD-10-CM | POA: Diagnosis not present

## 2012-04-10 DIAGNOSIS — N401 Enlarged prostate with lower urinary tract symptoms: Secondary | ICD-10-CM | POA: Insufficient documentation

## 2012-04-10 DIAGNOSIS — E039 Hypothyroidism, unspecified: Secondary | ICD-10-CM

## 2012-04-10 DIAGNOSIS — N138 Other obstructive and reflux uropathy: Secondary | ICD-10-CM | POA: Insufficient documentation

## 2012-04-10 MED ORDER — DUTASTERIDE-TAMSULOSIN HCL 0.5-0.4 MG PO CAPS: 1.0000 | ORAL_CAPSULE | Freq: Every day | ORAL | Status: DC

## 2012-04-10 NOTE — Patient Instructions (Signed)
The patient is instructed to continue all medications as prescribed. Schedule followup with check out clerk upon leaving the clinic  

## 2012-04-10 NOTE — Progress Notes (Signed)
Subjective:    Patient ID: Benjamin Johns, male    DOB: [birth year], 66 y.o.   MRN: [id number]  HPI This is a 66 year old man with a history of right breast tenderness now resolved.  Is also followed for hypothyroidism and a history of asthma.  Today he presents for recheck of not dysuria with a relatively normal sized prostate and status post treatment with an antibiotic that dated no change.  He states that he has at least 2-3 episodes of nocturia at night and this is affecting rest and lifestyle   Review of Systems  Constitutional: Negative for fever and fatigue.  HENT: Negative for hearing loss, congestion, neck pain and postnasal drip.   Eyes: Negative for discharge, redness and visual disturbance.  Respiratory: Negative for cough, shortness of breath and wheezing.   Cardiovascular: Negative for leg swelling.  Gastrointestinal: Negative for abdominal pain, constipation and abdominal distention.  Genitourinary: Negative for frequency.       Increased nocturia  Musculoskeletal: Negative for joint swelling and arthralgias.  Skin: Negative for color change and rash.  Neurological: Negative for weakness and light-headedness.  Hematological: Negative for adenopathy.  Psychiatric/Behavioral: Negative for behavioral problems.   Past Medical History  Diagnosis Date  . Allergy   . Asthma   . Thyroid disease     History   Social History  . Marital Status: Married    Spouse Name: N/A    Number of Children: N/A  . Years of Education: N/A   Occupational History  . Not on file.   Social History Main Topics  . Smoking status: Never Smoker   . Smokeless tobacco: Not on file  . Alcohol Use: Yes  . Drug Use: No  . Sexually Active: Not on file   Other Topics Concern  . Not on file   Social History Narrative  . No narrative on file    No past surgical history on file.  Family History  Problem Relation Age of Onset  . Dementia Mother   . Prostate cancer Father   .  Hypothyroidism Brother     Allergies  Allergen Reactions  . Amoxicillin-Pot Clavulanate     REACTION: hives    Current Outpatient Prescriptions on File Prior to Visit  Medication Sig Dispense Refill  . levothyroxine (SYNTHROID) 125 MCG tablet Take 1 tablet (125 mcg total) by mouth daily.  90 tablet  3    BP 130/78  Pulse 76  Temp 98.2 F (36.8 C)  Resp 16  Ht 6\' 2"  (1.88 m)  Wt 162 lb (73.483 kg)  BMI 20.80 kg/m2       Objective:   Physical Exam  Constitutional: He appears well-developed and well-nourished.  HENT:  Head: Normocephalic and atraumatic.  Eyes: Conjunctivae normal are normal. Pupils are equal, round, and reactive to light.  Neck: Normal range of motion. Neck supple.  Cardiovascular: Normal rate and regular rhythm.   Pulmonary/Chest: Effort normal and breath sounds normal.  Abdominal: Soft. Bowel sounds are normal.          Assessment & Plan:  Stable hypothyroidism.  Resolved breast fullness.  Stable asthma.  Persistent nocturia with relatively normal mass of prostate.  We will evaluate a free and total PSA if his PSA has a velocity increasing he'll be referred to urology because of family history of prostate cancer.  He is given samples of a combination drug for prostate hypertrophy he will follow back in 2 months time to see the effect of this  it has no effect on this medication be referred to urology for consideration for TURP because symptomology

## 2012-04-11 ENCOUNTER — Ambulatory Visit: Payer: Medicare Other | Admitting: Internal Medicine

## 2012-04-11 LAB — PSA, TOTAL AND FREE: PSA, Free Pct: 30 % (ref >25)

## 2012-05-01 DIAGNOSIS — R05 Cough: Secondary | ICD-10-CM | POA: Diagnosis not present

## 2012-07-17 DIAGNOSIS — R05 Cough: Secondary | ICD-10-CM | POA: Diagnosis not present

## 2012-08-08 ENCOUNTER — Encounter: Payer: Self-pay | Admitting: Gastroenterology

## 2012-10-08 ENCOUNTER — Other Ambulatory Visit: Payer: Self-pay | Admitting: Internal Medicine

## 2012-12-22 ENCOUNTER — Other Ambulatory Visit: Payer: Self-pay | Admitting: Internal Medicine

## 2013-05-19 ENCOUNTER — Encounter: Payer: Self-pay | Admitting: Gastroenterology

## 2013-06-03 DIAGNOSIS — R05 Cough: Secondary | ICD-10-CM | POA: Diagnosis not present

## 2013-06-03 DIAGNOSIS — J342 Deviated nasal septum: Secondary | ICD-10-CM | POA: Diagnosis not present

## 2013-07-07 DIAGNOSIS — J019 Acute sinusitis, unspecified: Secondary | ICD-10-CM | POA: Diagnosis not present

## 2013-10-07 ENCOUNTER — Encounter: Payer: Self-pay | Admitting: Internal Medicine

## 2013-10-20 ENCOUNTER — Ambulatory Visit (AMBULATORY_SURGERY_CENTER): Payer: Self-pay | Admitting: *Deleted

## 2013-10-20 VITALS — Ht 74.0 in | Wt 163.4 lb

## 2013-10-20 DIAGNOSIS — Z86010 Personal history of colonic polyps: Secondary | ICD-10-CM

## 2013-10-20 MED ORDER — MOVIPREP 100 G PO SOLR: 1.0000 | Freq: Once | ORAL | Status: DC

## 2013-10-20 NOTE — Progress Notes (Signed)
Denies allergies to eggs or soy products. Denies complications with sedation or anesthesia. 

## 2013-10-28 DIAGNOSIS — J331 Polypoid sinus degeneration: Secondary | ICD-10-CM | POA: Diagnosis not present

## 2013-10-28 DIAGNOSIS — R439 Unspecified disturbances of smell and taste: Secondary | ICD-10-CM | POA: Diagnosis not present

## 2013-10-28 DIAGNOSIS — J309 Allergic rhinitis, unspecified: Secondary | ICD-10-CM | POA: Diagnosis not present

## 2013-10-30 ENCOUNTER — Telehealth: Payer: Self-pay | Admitting: Internal Medicine

## 2013-10-30 NOTE — Telephone Encounter (Signed)
No charge, but please facilitate rescheduling

## 2013-11-02 ENCOUNTER — Encounter: Payer: Medicare Other | Admitting: Internal Medicine

## 2013-11-05 DIAGNOSIS — J331 Polypoid sinus degeneration: Secondary | ICD-10-CM | POA: Diagnosis not present

## 2013-11-06 ENCOUNTER — Encounter: Payer: Self-pay | Admitting: Internal Medicine

## 2013-11-06 NOTE — Telephone Encounter (Signed)
I called him but was not available on the phone today so I sent him a letter reminding him to reschedule Colonoscopy appt./yesi

## 2013-11-27 DIAGNOSIS — J45909 Unspecified asthma, uncomplicated: Secondary | ICD-10-CM | POA: Diagnosis not present

## 2013-12-23 DIAGNOSIS — J45909 Unspecified asthma, uncomplicated: Secondary | ICD-10-CM | POA: Diagnosis not present

## 2013-12-28 ENCOUNTER — Encounter: Payer: Self-pay | Admitting: Internal Medicine

## 2014-01-10 ENCOUNTER — Other Ambulatory Visit: Payer: Self-pay | Admitting: Internal Medicine

## 2014-02-22 ENCOUNTER — Ambulatory Visit (AMBULATORY_SURGERY_CENTER): Payer: Self-pay

## 2014-02-22 VITALS — Ht 74.0 in | Wt 168.0 lb

## 2014-02-22 DIAGNOSIS — Z860100 Personal history of colon polyps, unspecified: Secondary | ICD-10-CM

## 2014-02-22 NOTE — Progress Notes (Signed)
No allergies to eggs or soy No past problems with anesthesia No home oxygen No diet/weight loss meds  Has email  Emmi instructions given for colonoscopy 

## 2014-03-01 ENCOUNTER — Ambulatory Visit (AMBULATORY_SURGERY_CENTER): Payer: Medicare Other | Admitting: Internal Medicine

## 2014-03-01 ENCOUNTER — Encounter: Payer: Self-pay | Admitting: Internal Medicine

## 2014-03-01 VITALS — BP 129/89 | HR 71 | Temp 97.0°F | Resp 19 | Ht 74.0 in | Wt 168.0 lb

## 2014-03-01 DIAGNOSIS — Z86010 Personal history of colonic polyps: Secondary | ICD-10-CM | POA: Diagnosis not present

## 2014-03-01 DIAGNOSIS — D126 Benign neoplasm of colon, unspecified: Secondary | ICD-10-CM

## 2014-03-01 DIAGNOSIS — J45909 Unspecified asthma, uncomplicated: Secondary | ICD-10-CM | POA: Diagnosis not present

## 2014-03-01 DIAGNOSIS — E039 Hypothyroidism, unspecified: Secondary | ICD-10-CM | POA: Diagnosis not present

## 2014-03-01 MED ORDER — SODIUM CHLORIDE 0.9 % IV SOLN: 500.0000 mL | INTRAVENOUS | Status: DC

## 2014-03-01 NOTE — Op Note (Signed)
[place]  [address] [place], [id number]   COLONOSCOPY PROCEDURE REPORT  PATIENT: Benjamin Johns, Benjamin Johns  MR#: [id number] BIRTHDATE: [birth year] , 61  yrs. old GENDER: Male ENDOSCOPIST: Jerene Bears, MD PROCEDURE DATE:  03/01/2014 PROCEDURE:   Colonoscopy with cold biopsy polypectomy First Screening Colonoscopy - Avg.  risk and is 50 yrs.  old or older - No.  Prior Negative Screening - Now for repeat screening. N/A  History of Adenoma - Now for follow-up colonoscopy & has been > or = to 3 yrs.  Yes hx of adenoma.  Has been 3 or more years since last colonoscopy.  Polyps Removed Today? Yes. ASA CLASS:   Class II INDICATIONS:elevated risk screening, Patient's personal history of adenomatous colon polyps, and Last colonoscopy performed 2011 (Dr. Sharlett Iles). MEDICATIONS: MAC sedation, administered by CRNA and propofol (Diprivan) 350mg  IV  DESCRIPTION OF PROCEDURE:   After the risks benefits and alternatives of the procedure were thoroughly explained, informed consent was obtained.  A digital rectal exam revealed no rectal mass.   The LB [id number]  endoscope was introduced through the anus and advanced to the cecum, which was identified by both the appendix and ileocecal valve. No adverse events experienced. The quality of the prep was good, using MoviPrep  The instrument was then slowly withdrawn as the colon was fully examined.   COLON FINDINGS: Six sessile polyps ranging between 3-55mm in size were found in the ascending colon, at the hepatic flexure, in the descending colon, sigmoid colon, and rectum (2).  Polypectomy was performed with cold forceps.  All resections were complete and all polyp tissue was completely retrieved.   The colon mucosa was otherwise normal.  Retroflexed views revealed internal hemorrhoids. The time to cecum=3 minutes 23 seconds.  Withdrawal time=13 minutes 37 seconds.  The scope was withdrawn and the procedure  completed. COMPLICATIONS: There were no complications.  ENDOSCOPIC IMPRESSION: 1.   Six sessile polyps ranging between 3-33mm in size were found in the ascending colon, at the hepatic flexure, in the descending colon, sigmoid colon, and rectum; Polypectomy was performed with cold forceps 2.   The colon mucosa was otherwise normal  RECOMMENDATIONS: 1.  Await pathology results 2.  Timing of repeat colonoscopy will be determined by pathology findings. 3.  You will receive a letter within 1-2 weeks with the results of your biopsy as well as final recommendations.  Please call my office if you have not received a letter after 3 weeks.   eSigned:  Jerene Bears, MD 03/01/2014 2:21 PM      cc: The Patient and Ricard Dillon, MD

## 2014-03-01 NOTE — Progress Notes (Signed)
Procedure ends, to recovery, report given and VSS. 

## 2014-03-01 NOTE — Progress Notes (Signed)
Called to room to assist during endoscopic procedure.  Patient ID and intended procedure confirmed with present staff. Received instructions for my participation in the procedure from the performing physician.  

## 2014-03-01 NOTE — Patient Instructions (Signed)
YOU HAD AN ENDOSCOPIC PROCEDURE TODAY AT THE [facility]: Refer to the procedure report that was given to you for any specific questions about what was found during the examination.  If the procedure report does not answer your questions, please call your gastroenterologist to clarify.  If you requested that your care partner not be given the details of your procedure findings, then the procedure report has been included in a sealed envelope for you to review at your convenience later.  YOU SHOULD EXPECT: Some feelings of bloating in the abdomen. Passage of more gas than usual.  Walking can help get rid of the air that was put into your GI tract during the procedure and reduce the bloating. If you had a lower endoscopy (such as a colonoscopy or flexible sigmoidoscopy) you may notice spotting of blood in your stool or on the toilet paper. If you underwent a bowel prep for your procedure, then you may not have a normal bowel movement for a few days.  DIET: Your first meal following the procedure should be a light meal and then it is ok to progress to your normal diet.  A half-sandwich or bowl of soup is an example of a good first meal.  Heavy or fried foods are harder to digest and may make you feel nauseous or bloated.  Likewise meals heavy in dairy and vegetables can cause extra gas to form and this can also increase the bloating.  Drink plenty of fluids but you should avoid alcoholic beverages for 24 hours.  ACTIVITY: Your care partner should take you home directly after the procedure.  You should plan to take it easy, moving slowly for the rest of the day.  You can resume normal activity the day after the procedure however you should NOT DRIVE or use heavy machinery for 24 hours (because of the sedation medicines used during the test).    SYMPTOMS TO REPORT IMMEDIATELY: A gastroenterologist can be reached at any hour.  During normal business hours, 8:30 AM to 5:00 PM Monday through Friday,  call [phone].  After hours and on weekends, please call the GI answering service at [phone] who will take a message and have the physician on call contact you.   Following lower endoscopy (colonoscopy or flexible sigmoidoscopy):  Excessive amounts of blood in the stool  Significant tenderness or worsening of abdominal pains  Swelling of the abdomen that is new, acute  Fever of 100F or higher    FOLLOW UP: If any biopsies were taken you will be contacted by phone or by letter within the next 1-3 weeks.  Call your gastroenterologist if you have not heard about the biopsies in 3 weeks.  Our staff will call the home number listed on your records the next business day following your procedure to check on you and address any questions or concerns that you may have at that time regarding the information given to you following your procedure. This is a courtesy call and so if there is no answer at the home number and we have not heard from you through the emergency physician on call, we will assume that you have returned to your regular daily activities without incident.  SIGNATURES/CONFIDENTIALITY: You and/or your care partner have signed paperwork which will be entered into your electronic medical record.  These signatures attest to the fact that that the information above on your After Visit Summary has been reviewed and is understood.  Full responsibility of the confidentiality   this discharge information lies with you and/or your care-partner.   INFORMATION ON POLYPS GIVEN TO YOU TODAY 

## 2014-03-02 ENCOUNTER — Telehealth: Payer: Self-pay | Admitting: *Deleted

## 2014-03-02 NOTE — Telephone Encounter (Signed)
  Follow up Call-  Call back number 03/01/2014  Post procedure Call Back phone  # [phone]  Permission to leave phone message Yes     Patient questions:  Do you have a fever, pain , or abdominal swelling? No. Pain Score  0 *  Have you tolerated food without any problems? Yes.    Have you been able to return to your normal activities? yes  Do you have any questions about your discharge instructions: Diet   no Medications  No. Follow up visit  No.  Do you have questions or concerns about your Care? No.  Actions: * If pain score is 4 or above: No action needed, pain <4.

## 2014-03-08 ENCOUNTER — Encounter: Payer: Self-pay | Admitting: Internal Medicine

## 2014-03-16 HISTORY — PX: NASAL SINUS SURGERY: SHX719

## 2014-04-08 ENCOUNTER — Other Ambulatory Visit: Payer: Self-pay | Admitting: Otolaryngology

## 2014-04-08 DIAGNOSIS — J322 Chronic ethmoidal sinusitis: Secondary | ICD-10-CM | POA: Diagnosis not present

## 2014-04-08 DIAGNOSIS — J320 Chronic maxillary sinusitis: Secondary | ICD-10-CM | POA: Diagnosis not present

## 2014-04-08 DIAGNOSIS — J330 Polyp of nasal cavity: Secondary | ICD-10-CM | POA: Diagnosis not present

## 2014-04-08 DIAGNOSIS — J321 Chronic frontal sinusitis: Secondary | ICD-10-CM | POA: Diagnosis not present

## 2014-04-08 DIAGNOSIS — J329 Chronic sinusitis, unspecified: Secondary | ICD-10-CM | POA: Diagnosis not present

## 2014-04-08 DIAGNOSIS — J339 Nasal polyp, unspecified: Secondary | ICD-10-CM | POA: Diagnosis not present

## 2014-04-08 DIAGNOSIS — J323 Chronic sphenoidal sinusitis: Secondary | ICD-10-CM | POA: Diagnosis not present

## 2014-04-09 ENCOUNTER — Other Ambulatory Visit: Payer: Self-pay | Admitting: Internal Medicine

## 2014-04-23 ENCOUNTER — Ambulatory Visit (INDEPENDENT_AMBULATORY_CARE_PROVIDER_SITE_OTHER): Payer: Medicare Other | Admitting: Family Medicine

## 2014-04-23 ENCOUNTER — Encounter: Payer: Self-pay | Admitting: Family Medicine

## 2014-04-23 VITALS — BP 141/93 | HR 76 | Temp 98.2°F | Resp 18 | Ht 74.0 in | Wt 161.0 lb

## 2014-04-23 DIAGNOSIS — E039 Hypothyroidism, unspecified: Secondary | ICD-10-CM | POA: Diagnosis not present

## 2014-04-23 DIAGNOSIS — R351 Nocturia: Secondary | ICD-10-CM | POA: Diagnosis not present

## 2014-04-23 LAB — TSH: TSH: 0.01 u[IU]/mL — ABNORMAL LOW (ref 0.35–4.50)

## 2014-04-23 MED ORDER — OXYBUTYNIN CHLORIDE 5 MG PO TABS: ORAL_TABLET | ORAL | Status: DC

## 2014-04-23 NOTE — Progress Notes (Signed)
Office Note 05/02/2014  CC:  Chief Complaint  Patient presents with  . Establish Care    wants TSH checked.   . Urinary Frequency    getting up at night multiple times    HPI:  Benjamin Johns is a 68 y.o. White male who is here to transfer care. Patient's most recent primary MD: Dr. Arnoldo Morale (retiring from clinical practice). Old records in EPIC/[facility] EMR were reviewed prior to or during today's visit.  Most recent visit with Dr. Arnoldo Morale was 03/2012.  Says energy is down some and has some muscle aches in backs of legs and says usually this occurs when he hasn't taken his thyroid med regularly but he says he has been taking it regularly SO he asks for TSH check today.  Urinary frequency for at least a couple of years, only at night, avg 4 times a night.  Daytime not a problem.   No lower urinary tract obstructive sx's in daytime or night time.  No attempts to decrease fluid intake in evenings.  No evening caffeine drinks.  Dr. Arnoldo Morale tried a trial of abx-no help.  Also tried a combo med for BPH (5 alpha reductase inhib + alpha blocker) and this didn't help.  PSAs have been normal.  Prostate size has been normal on exam per past notes.  Past Medical History  Diagnosis Date  . Allergic rhinitis   . Intermittent asthma   . Hypothyroidism   . Adenomatous colon polyp 2011;02/2014    2015; six sessile polyps 3-5 mm in size, no high grade dysplasia. Recall 5 yrs (Dr. Hilarie Fredrickson)  . Family history of prostate cancer     Past Surgical History  Procedure Laterality Date  . Colonoscopy w/ polypectomy  2011;2015    Recall 5 yrs  . Nasal sinus surgery  03/2014    Dr. Wilburn Cornelia (polyps removed)    Family History  Problem Relation Age of Onset  . Dementia Mother   . Prostate cancer Father   . Hypothyroidism Brother   . Colon cancer Neg Hx   . Esophageal cancer Neg Hx   . Rectal cancer Neg Hx   . Stomach cancer Neg Hx     History   Social History  . Marital Status: Married    Spouse  Name: N/A    Number of Children: N/A  . Years of Education: N/A   Occupational History  . Not on file.   Social History Main Topics  . Smoking status: Never Smoker   . Smokeless tobacco: Never Used  . Alcohol Use: 1.0 oz/week    2 drink(s) per week  . Drug Use: No  . Sexual Activity: Not on file   Other Topics Concern  . Not on file   Social History Narrative   Married, 3 children, 2 grandchildren.   Lives in [place].   Retired Social research officer, government (age 56).   No tob, occ alcohol.            Outpatient Encounter Prescriptions as of 04/23/2014  Medication Sig  . [DISCONTINUED] levothyroxine (SYNTHROID, LEVOTHROID) 125 MCG tablet TAKE 1 TABLET BY MOUTH EVERY DAY  . levothyroxine (SYNTHROID, LEVOTHROID) 100 MCG tablet Take 1 tablet (100 mcg total) by mouth daily.  Marland Kitchen oxybutynin (DITROPAN) 5 MG tablet 1 tab po qhs    Allergies  Allergen Reactions  . Amoxicillin-Pot Clavulanate     REACTION: hives    ROS Review of Systems  Constitutional: Negative for fever and fatigue.  HENT: Negative for congestion  and sore throat.   Eyes: Negative for visual disturbance.  Respiratory: Negative for cough.   Cardiovascular: Negative for chest pain.  Gastrointestinal: Negative for nausea and abdominal pain.  Genitourinary: Negative for dysuria, frequency, hematuria and discharge.  Musculoskeletal: Negative for back pain and joint swelling.  Skin: Negative for rash.  Neurological: Negative for weakness and headaches.  Hematological: Negative for adenopathy.    PE; Blood pressure 141/93, pulse 76, temperature 98.2 F (36.8 C), temperature source Temporal, resp. rate 18, height 6\' 2"  (1.88 m), weight 161 lb (73.029 kg), SpO2 96.00%. Gen: Alert, well appearing.  Patient is oriented to person, place, time, and situation. XYB:FXOV: no injection, icteris, swelling, or exudate.  EOMI, PERRLA. Mouth: lips without lesion/swelling.  Oral mucosa pink and moist. Oropharynx without erythema,  exudate, or swelling.  CV: RRR, no m/r/g.  Occ ectopic beat. LUNGS: CTA bilat, nonlabored resps, good aeration in all lung fields.  Pertinent labs:  None today  ASSESSMENT AND PLAN:   Transfer pt  Nocturia Given poor response to trial of abx + trial of BPH meds in the past, will treat as urge incontinence with trial of ditropan 5mg  qhs. Therapeutic expectations and side effect profile of medication discussed today.  Patient's questions answered.   Hypothyroidism Due for TSH check today.  An After Visit Summary was printed and given to the patient.  Return in about 4 weeks (around 05/21/2014) for annual CPE (fasting).

## 2014-04-23 NOTE — Progress Notes (Signed)
Pre visit review using our clinic review tool, if applicable. No additional management support is needed unless otherwise documented below in the visit note. 

## 2014-04-26 ENCOUNTER — Encounter: Payer: Medicare Other | Admitting: Internal Medicine

## 2014-04-26 MED ORDER — LEVOTHYROXINE SODIUM 100 MCG PO TABS: 100.0000 ug | ORAL_TABLET | Freq: Every day | ORAL | Status: DC

## 2014-04-28 DIAGNOSIS — H5213 Myopia, bilateral: Secondary | ICD-10-CM | POA: Diagnosis not present

## 2014-04-28 DIAGNOSIS — H2513 Age-related nuclear cataract, bilateral: Secondary | ICD-10-CM | POA: Diagnosis not present

## 2014-05-02 DIAGNOSIS — E039 Hypothyroidism, unspecified: Secondary | ICD-10-CM | POA: Insufficient documentation

## 2014-05-02 DIAGNOSIS — R351 Nocturia: Secondary | ICD-10-CM | POA: Insufficient documentation

## 2014-05-02 NOTE — Assessment & Plan Note (Signed)
Given poor response to trial of abx + trial of BPH meds in the past, will treat as urge incontinence with trial of ditropan 5mg  qhs. Therapeutic expectations and side effect profile of medication discussed today.  Patient's questions answered.

## 2014-05-02 NOTE — Assessment & Plan Note (Signed)
Due for TSH check today 

## 2014-05-11 DIAGNOSIS — J4540 Moderate persistent asthma, uncomplicated: Secondary | ICD-10-CM | POA: Diagnosis not present

## 2014-05-27 ENCOUNTER — Encounter: Payer: Self-pay | Admitting: Family Medicine

## 2014-06-02 ENCOUNTER — Encounter: Payer: Self-pay | Admitting: Family Medicine

## 2014-06-02 ENCOUNTER — Ambulatory Visit (INDEPENDENT_AMBULATORY_CARE_PROVIDER_SITE_OTHER): Payer: Medicare Other | Admitting: Family Medicine

## 2014-06-02 VITALS — BP 127/80 | HR 65 | Temp 98.0°F | Resp 18 | Ht 74.0 in | Wt 161.0 lb

## 2014-06-02 DIAGNOSIS — Z13220 Encounter for screening for lipoid disorders: Secondary | ICD-10-CM

## 2014-06-02 DIAGNOSIS — N401 Enlarged prostate with lower urinary tract symptoms: Secondary | ICD-10-CM | POA: Diagnosis not present

## 2014-06-02 DIAGNOSIS — R351 Nocturia: Secondary | ICD-10-CM | POA: Diagnosis not present

## 2014-06-02 DIAGNOSIS — Z0000 Encounter for general adult medical examination without abnormal findings: Secondary | ICD-10-CM

## 2014-06-02 DIAGNOSIS — R5383 Other fatigue: Secondary | ICD-10-CM

## 2014-06-02 DIAGNOSIS — Z131 Encounter for screening for diabetes mellitus: Secondary | ICD-10-CM

## 2014-06-02 DIAGNOSIS — E039 Hypothyroidism, unspecified: Secondary | ICD-10-CM

## 2014-06-02 DIAGNOSIS — Z125 Encounter for screening for malignant neoplasm of prostate: Secondary | ICD-10-CM | POA: Diagnosis not present

## 2014-06-02 LAB — CBC WITH DIFFERENTIAL/PLATELET
Basophils Absolute: 0.1 10*3/uL (ref 0.0–0.1)
Basophils Relative: 0.7 % (ref 0.0–3.0)
EOS PCT: 12.2 % — AB (ref 0.0–5.0)
Eosinophils Absolute: 0.9 10*3/uL — ABNORMAL HIGH (ref 0.0–0.7)
HCT: 44.3 % (ref 39.0–52.0)
Hemoglobin: 14.5 g/dL (ref 13.0–17.0)
LYMPHS PCT: 27.9 % (ref 12.0–46.0)
Lymphs Abs: 2.1 10*3/uL (ref 0.7–4.0)
MCHC: 32.7 g/dL (ref 30.0–36.0)
MCV: 93.4 fl (ref 78.0–100.0)
MONO ABS: 0.6 10*3/uL (ref 0.1–1.0)
Monocytes Relative: 7.6 % (ref 3.0–12.0)
NEUTROS PCT: 51.6 % (ref 43.0–77.0)
Neutro Abs: 3.9 10*3/uL (ref 1.4–7.7)
Platelets: 359 10*3/uL (ref 150.0–400.0)
RBC: 4.75 Mil/uL (ref 4.22–5.81)
RDW: 13.8 % (ref 11.5–15.5)
WBC: 7.6 10*3/uL (ref 4.0–10.5)

## 2014-06-02 LAB — COMPREHENSIVE METABOLIC PANEL
ALT: 29 U/L (ref 0–53)
AST: 24 U/L (ref 0–37)
Albumin: 4.1 g/dL (ref 3.5–5.2)
Alkaline Phosphatase: 77 U/L (ref 39–117)
BILIRUBIN TOTAL: 1 mg/dL (ref 0.2–1.2)
BUN: 21 mg/dL (ref 6–23)
CO2: 24 mEq/L (ref 19–32)
Calcium: 9.6 mg/dL (ref 8.4–10.5)
Chloride: 104 mEq/L (ref 96–112)
Creatinine, Ser: 0.8 mg/dL (ref 0.4–1.5)
GFR: 101.44 mL/min (ref >60.00)
GLUCOSE: 75 mg/dL (ref 70–99)
Potassium: 4 mEq/L (ref 3.5–5.1)
Sodium: 140 mEq/L (ref 135–145)
Total Protein: 6.7 g/dL (ref 6.0–8.3)

## 2014-06-02 LAB — LIPID PANEL
CHOL/HDL RATIO: 3
CHOLESTEROL: 184 mg/dL (ref 0–200)
HDL: 63 mg/dL (ref >39.00)
LDL Cholesterol: 89 mg/dL (ref 0–99)
NonHDL: 121
TRIGLYCERIDES: 158 mg/dL — AB (ref 0.0–149.0)
VLDL: 31.6 mg/dL (ref 0.0–40.0)

## 2014-06-02 LAB — PSA, MEDICARE: PSA: 0.99 ng/ml (ref 0.10–4.00)

## 2014-06-02 LAB — TSH: TSH: 0.03 u[IU]/mL — ABNORMAL LOW (ref 0.35–4.50)

## 2014-06-02 MED ORDER — OXYBUTYNIN CHLORIDE ER 10 MG PO TB24: 10.0000 mg | ORAL_TABLET | Freq: Every day | ORAL | Status: DC

## 2014-06-02 NOTE — Progress Notes (Signed)
Office Note 06/02/2014  CC:  Chief Complaint  Patient presents with  . Annual Exam    fasting    HPI:  Benjamin Johns is a 68 y.o. White male who is here for f/u nocturia and hypothyroidism. Also asks for screening for hyperlipidemia and screening for diabetes.   Says he has mild fatigue lately due to poor sleep b/c of nocturia.  He says nocturia is improved a little (gets up 3 times per night instead of 4).   Says change in thyroid dosing recently has not made him feel any different. He has no hx of thyroid goiter or multinodular thyroid or thyroid cancer.  He desires DRE and PSA for prostate cancer screening today.  Past Medical History  Diagnosis Date  . Allergic rhinitis   . Intermittent asthma   . Hypothyroidism   . Adenomatous colon polyp 2011;02/2014    2015; six sessile polyps 3-5 mm in size, no high grade dysplasia. Recall 5 yrs (Dr. Hilarie Fredrickson)  . Family history of prostate cancer   . BPH (benign prostatic hypertrophy)     Past Surgical History  Procedure Laterality Date  . Colonoscopy w/ polypectomy  2011;2015    Recall 5 yrs  . Nasal sinus surgery  03/2014    Dr. Wilburn Cornelia (polyps removed)  . Tonsillectomy      Family History  Problem Relation Age of Onset  . Dementia Mother   . Prostate cancer Father   . Hypothyroidism Brother   . Colon cancer Neg Hx   . Esophageal cancer Neg Hx   . Rectal cancer Neg Hx   . Stomach cancer Neg Hx     History   Social History  . Marital Status: Married    Spouse Name: N/A    Number of Children: N/A  . Years of Education: N/A   Occupational History  . Not on file.   Social History Main Topics  . Smoking status: Never Smoker   . Smokeless tobacco: Never Used  . Alcohol Use: 1.0 oz/week    2 drink(s) per week  . Drug Use: No  . Sexual Activity: Not on file   Other Topics Concern  . Not on file   Social History Narrative   Married, 3 children, 2 grandchildren.   Lives in [place].   Retired Engineer, site (age 18).   No tob, occ alcohol.            Outpatient Prescriptions Prior to Visit  Medication Sig Dispense Refill  . levothyroxine (SYNTHROID, LEVOTHROID) 100 MCG tablet Take 1 tablet (100 mcg total) by mouth daily. 90 tablet 0  . oxybutynin (DITROPAN) 5 MG tablet 1 tab po qhs 30 tablet 1   No facility-administered medications prior to visit.    Allergies  Allergen Reactions  . Amoxicillin-Pot Clavulanate     REACTION: hives    ROS Review of Systems  Constitutional: Positive for fatigue (mild). Negative for fever.  HENT: Negative for congestion and sore throat.   Eyes: Negative for visual disturbance.  Respiratory: Negative for cough.   Cardiovascular: Negative for chest pain.  Gastrointestinal: Negative for nausea and abdominal pain.  Genitourinary: Negative for dysuria.  Musculoskeletal: Negative for back pain and joint swelling.  Skin: Negative for rash.  Neurological: Negative for weakness and headaches.  Hematological: Negative for adenopathy.    PE; Blood pressure 127/80, pulse 65, temperature 98 F (36.7 C), temperature source Temporal, resp. rate 18, height 6\' 2"  (1.88 m), weight 161 lb (73.029 kg),  SpO2 96 %. Gen: Alert, well appearing.  Patient is oriented to person, place, time, and situation. AFFECT: pleasant, lucid thought and speech. ENT: Ears: EACs clear, normal epithelium.  TMs with good light reflex and landmarks bilaterally.  Eyes: no injection, icteris, swelling, or exudate.  EOMI, PERRLA. Nose: no drainage or turbinate edema/swelling.  No injection or focal lesion.  Mouth: lips without lesion/swelling.  Oral mucosa pink and moist.  Dentition intact and without obvious caries or gingival swelling.  Oropharynx without erythema, exudate, or swelling.  Neck: supple/nontender.  No LAD, mass, or TM.  Carotid pulses 2+ bilaterally, without bruits. CV: RRR, no m/r/g.   LUNGS: CTA bilat, nonlabored resps, good aeration in all lung fields. ABD: soft,  NT, ND, BS normal.  No hepatospenomegaly or mass.  No bruits. EXT: no clubbing, cyanosis, or edema.  Musculoskeletal: no joint swelling, erythema, warmth, or tenderness.  ROM of all joints intact. Skin - no sores or suspicious lesions or rashes or color changes Rectal exam: negative without mass, lesions or tenderness, PROSTATE EXAM: smooth and symmetric without nodules or tenderness.   Pertinent labs:  Lab Results  Component Value Date   TSH 0.01* 04/23/2014   ASSESSMENT AND PLAN:   1) Nocturia: he responded a little bit to treatment as urge incont. Will change to ditropan XL 10mg  dosing hs.  He says the ditropan 5mg  short acting caused a little low back itching, so we'll see how this does.  2) Hypothyroidism: recheck TSH since dose change about 5 wks ago.  3) Prostate ca screening: DRE normal today.  PSA drawn today.  4) Cholesterol and diabetes screening today at pt's request: FLP and CMET.  5) Prev health care: pt declined prevnar today--"maybe next time".  An After Visit Summary was printed and given to the patient.  FOLLOW UP:  Return in about 6 months (around 12/01/2014) for f/u hypoth and urinary incont.

## 2014-06-02 NOTE — Progress Notes (Signed)
Pre visit review using our clinic review tool, if applicable. No additional management support is needed unless otherwise documented below in the visit note. 

## 2014-06-03 ENCOUNTER — Encounter: Payer: Self-pay | Admitting: Family Medicine

## 2014-06-03 ENCOUNTER — Other Ambulatory Visit: Payer: Self-pay | Admitting: Family Medicine

## 2014-06-03 MED ORDER — LEVOTHYROXINE SODIUM 88 MCG PO TABS: 88.0000 ug | ORAL_TABLET | Freq: Every day | ORAL | Status: DC

## 2014-06-11 ENCOUNTER — Ambulatory Visit: Payer: Medicare Other | Admitting: Family Medicine

## 2014-06-30 ENCOUNTER — Other Ambulatory Visit (INDEPENDENT_AMBULATORY_CARE_PROVIDER_SITE_OTHER): Payer: Medicare Other

## 2014-06-30 DIAGNOSIS — E039 Hypothyroidism, unspecified: Secondary | ICD-10-CM | POA: Diagnosis not present

## 2014-06-30 LAB — TSH: TSH: 0.05 u[IU]/mL — AB (ref 0.35–4.50)

## 2014-07-06 ENCOUNTER — Other Ambulatory Visit: Payer: Self-pay | Admitting: *Deleted

## 2014-07-06 MED ORDER — LEVOTHYROXINE SODIUM 88 MCG PO TABS: 88.0000 ug | ORAL_TABLET | Freq: Every day | ORAL | Status: DC

## 2014-08-26 ENCOUNTER — Telehealth: Payer: Self-pay | Admitting: Family Medicine

## 2014-08-26 NOTE — Telephone Encounter (Signed)
Noted  

## 2014-08-26 NOTE — Telephone Encounter (Signed)
Pt LMOM stating the generic synthroid was causing him to itch.  I contacted pharmacy and made change to medication so he got Brand Name only.  Pt aware.

## 2014-08-31 ENCOUNTER — Other Ambulatory Visit (INDEPENDENT_AMBULATORY_CARE_PROVIDER_SITE_OTHER): Payer: Medicare Other

## 2014-08-31 DIAGNOSIS — E039 Hypothyroidism, unspecified: Secondary | ICD-10-CM | POA: Diagnosis not present

## 2014-08-31 LAB — TSH: TSH: 0.25 u[IU]/mL — ABNORMAL LOW (ref 0.35–4.50)

## 2014-09-01 ENCOUNTER — Other Ambulatory Visit: Payer: Self-pay | Admitting: Family Medicine

## 2014-09-01 MED ORDER — LEVOTHYROXINE SODIUM 75 MCG PO TABS: 75.0000 ug | ORAL_TABLET | Freq: Every day | ORAL | Status: DC

## 2014-10-29 DIAGNOSIS — J45909 Unspecified asthma, uncomplicated: Secondary | ICD-10-CM | POA: Diagnosis not present

## 2014-10-29 DIAGNOSIS — J331 Polypoid sinus degeneration: Secondary | ICD-10-CM | POA: Diagnosis not present

## 2014-10-29 DIAGNOSIS — J309 Allergic rhinitis, unspecified: Secondary | ICD-10-CM | POA: Diagnosis not present

## 2014-11-01 ENCOUNTER — Encounter: Payer: Self-pay | Admitting: Family Medicine

## 2014-12-01 ENCOUNTER — Ambulatory Visit: Payer: Medicare Other | Admitting: Family Medicine

## 2014-12-14 ENCOUNTER — Other Ambulatory Visit: Payer: Self-pay | Admitting: Family Medicine

## 2014-12-22 ENCOUNTER — Ambulatory Visit (INDEPENDENT_AMBULATORY_CARE_PROVIDER_SITE_OTHER): Payer: Medicare Other | Admitting: Family Medicine

## 2014-12-22 ENCOUNTER — Encounter: Payer: Self-pay | Admitting: Family Medicine

## 2014-12-22 ENCOUNTER — Other Ambulatory Visit: Payer: Self-pay | Admitting: Family Medicine

## 2014-12-22 VITALS — BP 128/80 | HR 68 | Temp 97.9°F | Resp 16 | Wt 161.0 lb

## 2014-12-22 DIAGNOSIS — Z23 Encounter for immunization: Secondary | ICD-10-CM | POA: Diagnosis not present

## 2014-12-22 DIAGNOSIS — N401 Enlarged prostate with lower urinary tract symptoms: Secondary | ICD-10-CM

## 2014-12-22 DIAGNOSIS — Z0000 Encounter for general adult medical examination without abnormal findings: Secondary | ICD-10-CM | POA: Diagnosis not present

## 2014-12-22 DIAGNOSIS — N3941 Urge incontinence: Secondary | ICD-10-CM | POA: Diagnosis not present

## 2014-12-22 DIAGNOSIS — E039 Hypothyroidism, unspecified: Secondary | ICD-10-CM

## 2014-12-22 DIAGNOSIS — N138 Other obstructive and reflux uropathy: Secondary | ICD-10-CM

## 2014-12-22 LAB — TSH: TSH: 3.04 u[IU]/mL (ref 0.35–4.50)

## 2014-12-22 MED ORDER — SYNTHROID 75 MCG PO TABS: 75.0000 ug | ORAL_TABLET | Freq: Every day | ORAL | Status: DC

## 2014-12-22 NOTE — Progress Notes (Signed)
OFFICE VISIT  12/22/2014   CC:  Chief Complaint  Patient presents with  . Follow-up    6 month f/u. Pt is fasting.   HPI:    Patient is a 69 y.o. Caucasian male who presents for 7 mo f/u hypothyrodism and BPH/urinary incontinence problems. We've been down-titrating his synthroid due to low TSH-due for TSH recheck today.Has been compliant with his medication.  Switched his ditropan 5mg  to the XL 10mg  dosing last visit.  Notes no change but says he is doing fine.  Cholesterol and diabetes screening las visit were normal. He reports feeling very well, desires no changes in meds, has no acute complaints.   Past Medical History  Diagnosis Date  . Allergic rhinitis   . Intermittent asthma   . Hypothyroidism   . Adenomatous colon polyp 2011;02/2014    2015; six sessile polyps 3-5 mm in size, no high grade dysplasia. Recall 5 yrs (Dr. Hilarie Fredrickson)  . Family history of prostate cancer   . BPH (benign prostatic hypertrophy)     Past Surgical History  Procedure Laterality Date  . Colonoscopy w/ polypectomy  2011;2015    Recall 5 yrs  . Nasal sinus surgery  03/2014    Dr. Wilburn Cornelia (polyps removed); nasal polyposis, allerg rhin + asthma when taking NSAIDs, question of Samter's triad per Dr. Wilburn Cornelia.  Allergist eval recommended/being pursued as of 10/29/14  . Tonsillectomy      Outpatient Prescriptions Prior to Visit  Medication Sig Dispense Refill  . fluticasone (FLONASE) 50 MCG/ACT nasal spray   11  . oxybutynin (DITROPAN-XL) 10 MG 24 hr tablet Take 1 tablet (10 mg total) by mouth at bedtime. 30 tablet 6  . SYNTHROID 75 MCG tablet TAKE 1 TABLET BY MOUTH EVERY DAY 90 tablet 0   No facility-administered medications prior to visit.    Allergies  Allergen Reactions  . Amoxicillin-Pot Clavulanate     REACTION: hives    ROS As per HPI  PE: Blood pressure 128/80, pulse 68, temperature 97.9 F (36.6 C), temperature source Oral, resp. rate 16, weight 161 lb (73.029 kg), SpO2 93  %. Gen: Alert, well appearing.  Patient is oriented to person, place, time, and situation. AFFECT: pleasant, lucid thought and speech. No further exam today.  LABS:  Lab Results  Component Value Date   TSH 0.25* 08/31/2014   Lab Results  Component Value Date   WBC 7.6 06/02/2014   HGB 14.5 06/02/2014   HCT 44.3 06/02/2014   MCV 93.4 06/02/2014   PLT 359.0 06/02/2014   Lab Results  Component Value Date   CREATININE 0.8 06/02/2014   BUN 21 06/02/2014   NA 140 06/02/2014   K 4.0 06/02/2014   CL 104 06/02/2014   CO2 24 06/02/2014   Lab Results  Component Value Date   ALT 29 06/02/2014   AST 24 06/02/2014   ALKPHOS 77 06/02/2014   BILITOT 1.0 06/02/2014   Lab Results  Component Value Date   CHOL 184 06/02/2014   Lab Results  Component Value Date   HDL 63.00 06/02/2014   Lab Results  Component Value Date   LDLCALC 89 06/02/2014   Lab Results  Component Value Date   TRIG 158.0* 06/02/2014   Lab Results  Component Value Date   CHOLHDL 3 06/02/2014   Lab Results  Component Value Date   PSA 0.99 06/02/2014   PSA 0.90 04/10/2012   PSA 0.93 09/12/2011    IMPRESSION AND PLAN:  1) Hypothyroidism: recheck TSH, adjust  synthroid dose as appropriate. RF med x 1 yr if TSH in normal range.  2) BPH with lower urinary tract obstructive sx's+ urge incontinence : The current medical regimen is effective;  continue present plan and medications.  3) Preventative health care: prevnar 13 IM today. Pneumovax in 1 yr.  An After Visit Summary was printed and given to the patient.  FOLLOW UP: Return in about 1 year (around 12/22/2015) for annual medicare wellness visit.

## 2014-12-22 NOTE — Progress Notes (Signed)
Pre visit review using our clinic review tool, if applicable. No additional management support is needed unless otherwise documented below in the visit note. 

## 2015-03-10 DIAGNOSIS — J3089 Other allergic rhinitis: Secondary | ICD-10-CM | POA: Diagnosis not present

## 2015-03-10 DIAGNOSIS — J339 Nasal polyp, unspecified: Secondary | ICD-10-CM | POA: Diagnosis not present

## 2015-03-10 DIAGNOSIS — J3081 Allergic rhinitis due to animal (cat) (dog) hair and dander: Secondary | ICD-10-CM | POA: Diagnosis not present

## 2015-03-10 DIAGNOSIS — J301 Allergic rhinitis due to pollen: Secondary | ICD-10-CM | POA: Diagnosis not present

## 2015-03-10 DIAGNOSIS — J4540 Moderate persistent asthma, uncomplicated: Secondary | ICD-10-CM | POA: Diagnosis not present

## 2015-03-14 ENCOUNTER — Encounter: Payer: Self-pay | Admitting: Family Medicine

## 2015-03-15 ENCOUNTER — Other Ambulatory Visit: Payer: Self-pay | Admitting: Allergy and Immunology

## 2015-03-15 ENCOUNTER — Ambulatory Visit
Admission: RE | Admit: 2015-03-15 | Discharge: 2015-03-15 | Disposition: A | Discharge status: Home / Self Care | Payer: Medicare Other | Source: Ambulatory Visit | Attending: Allergy and Immunology | Admitting: Allergy and Immunology

## 2015-03-15 DIAGNOSIS — R0602 Shortness of breath: Secondary | ICD-10-CM | POA: Diagnosis not present

## 2015-03-15 DIAGNOSIS — J4540 Moderate persistent asthma, uncomplicated: Secondary | ICD-10-CM

## 2015-09-08 ENCOUNTER — Encounter: Payer: Self-pay | Admitting: Gastroenterology

## 2015-10-26 DIAGNOSIS — J339 Nasal polyp, unspecified: Secondary | ICD-10-CM | POA: Diagnosis not present

## 2015-10-26 DIAGNOSIS — J3081 Allergic rhinitis due to animal (cat) (dog) hair and dander: Secondary | ICD-10-CM | POA: Diagnosis not present

## 2015-10-26 DIAGNOSIS — J4540 Moderate persistent asthma, uncomplicated: Secondary | ICD-10-CM | POA: Diagnosis not present

## 2015-10-26 DIAGNOSIS — J301 Allergic rhinitis due to pollen: Secondary | ICD-10-CM | POA: Diagnosis not present

## 2015-11-02 ENCOUNTER — Encounter: Payer: Self-pay | Admitting: Family Medicine

## 2015-12-23 ENCOUNTER — Ambulatory Visit: Payer: Medicare Other | Admitting: Family Medicine

## 2016-02-13 DIAGNOSIS — H2513 Age-related nuclear cataract, bilateral: Secondary | ICD-10-CM | POA: Diagnosis not present

## 2016-02-13 DIAGNOSIS — H25043 Posterior subcapsular polar age-related cataract, bilateral: Secondary | ICD-10-CM | POA: Diagnosis not present

## 2016-03-11 IMAGING — CR DG CHEST 2V
2 series · 2 of 2 positions shown · non-contrast
Comparison: 02/24/2009

CLINICAL DATA: Shortness of breath.  Asthma.

EXAM:
CHEST  2 VIEW

[w chest pa]
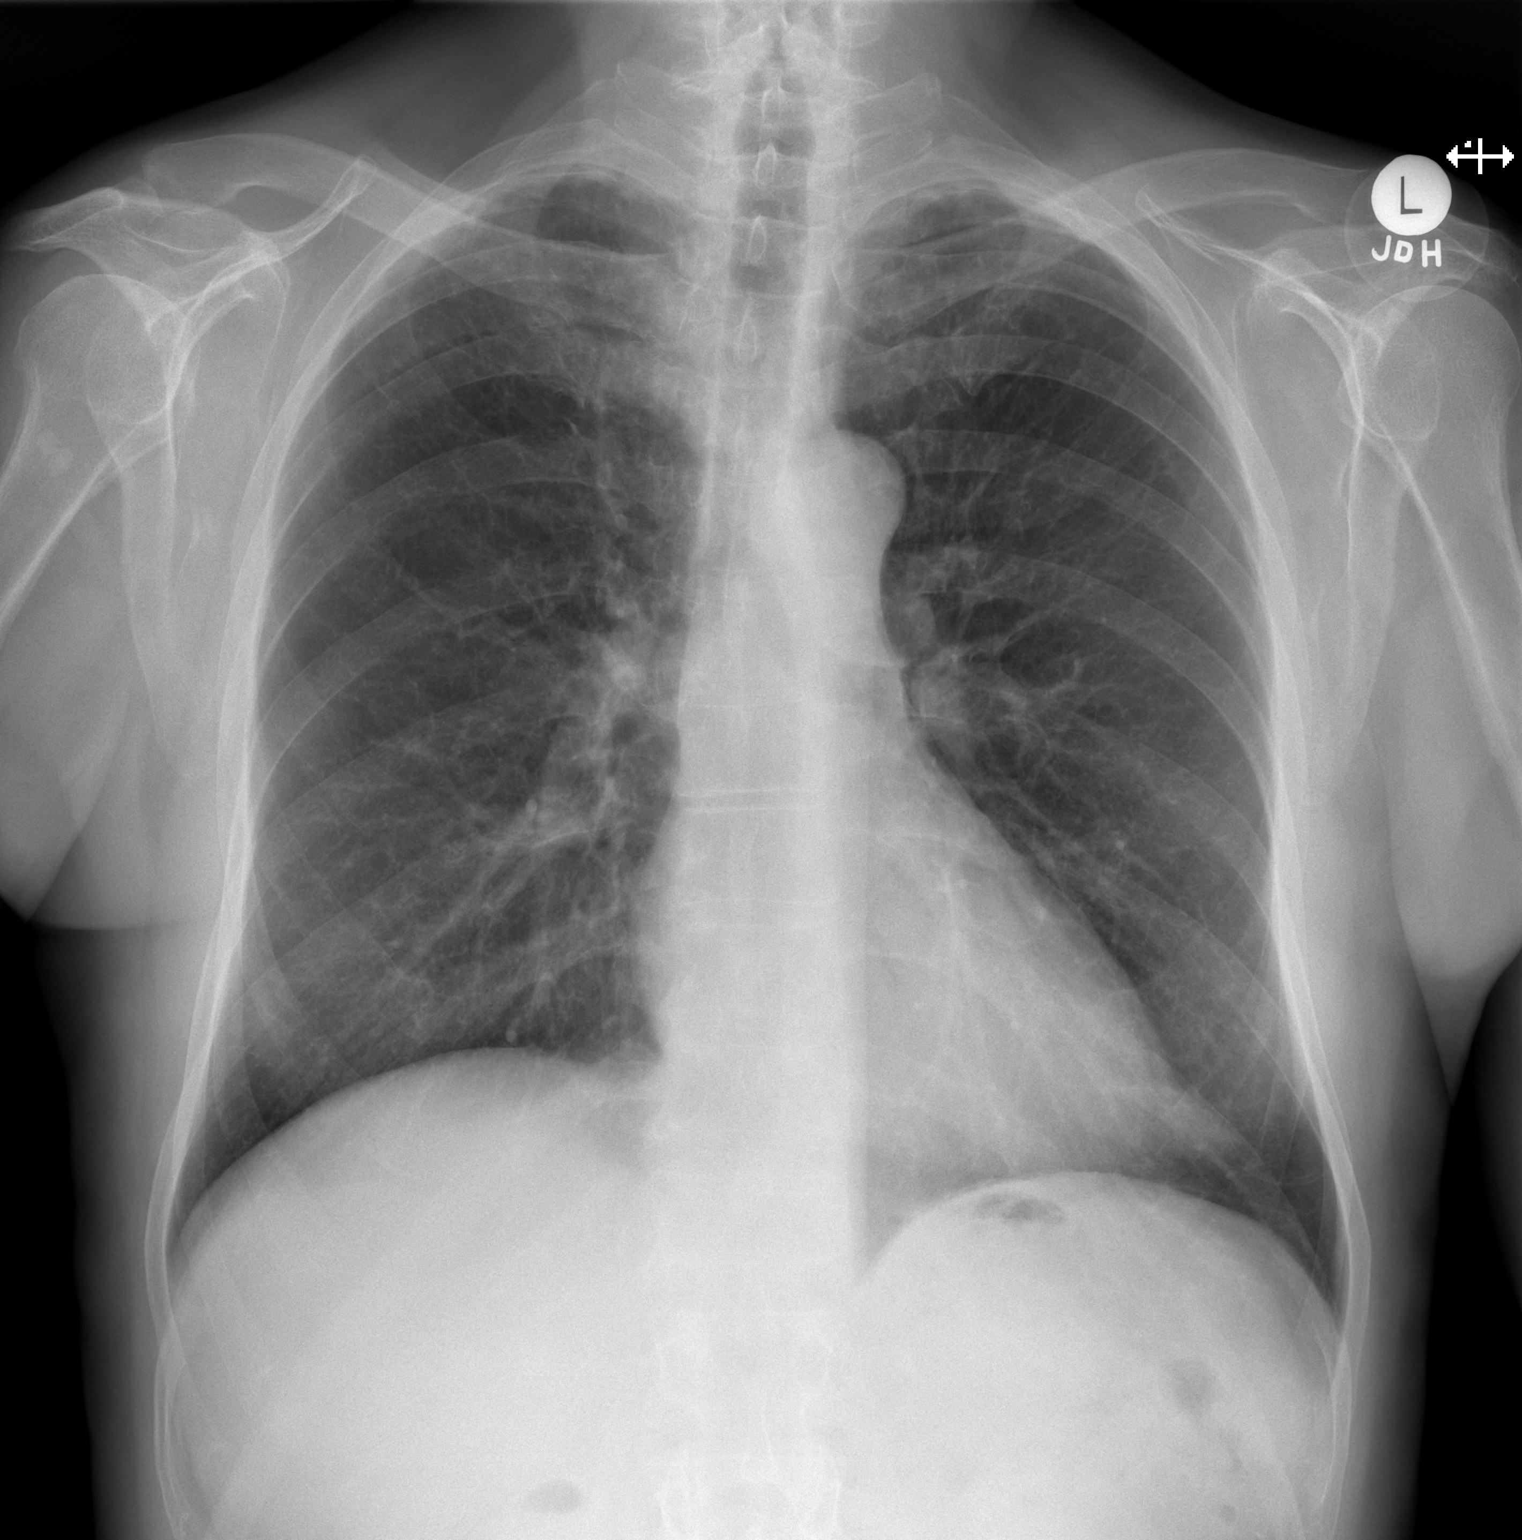

[w chest lat]
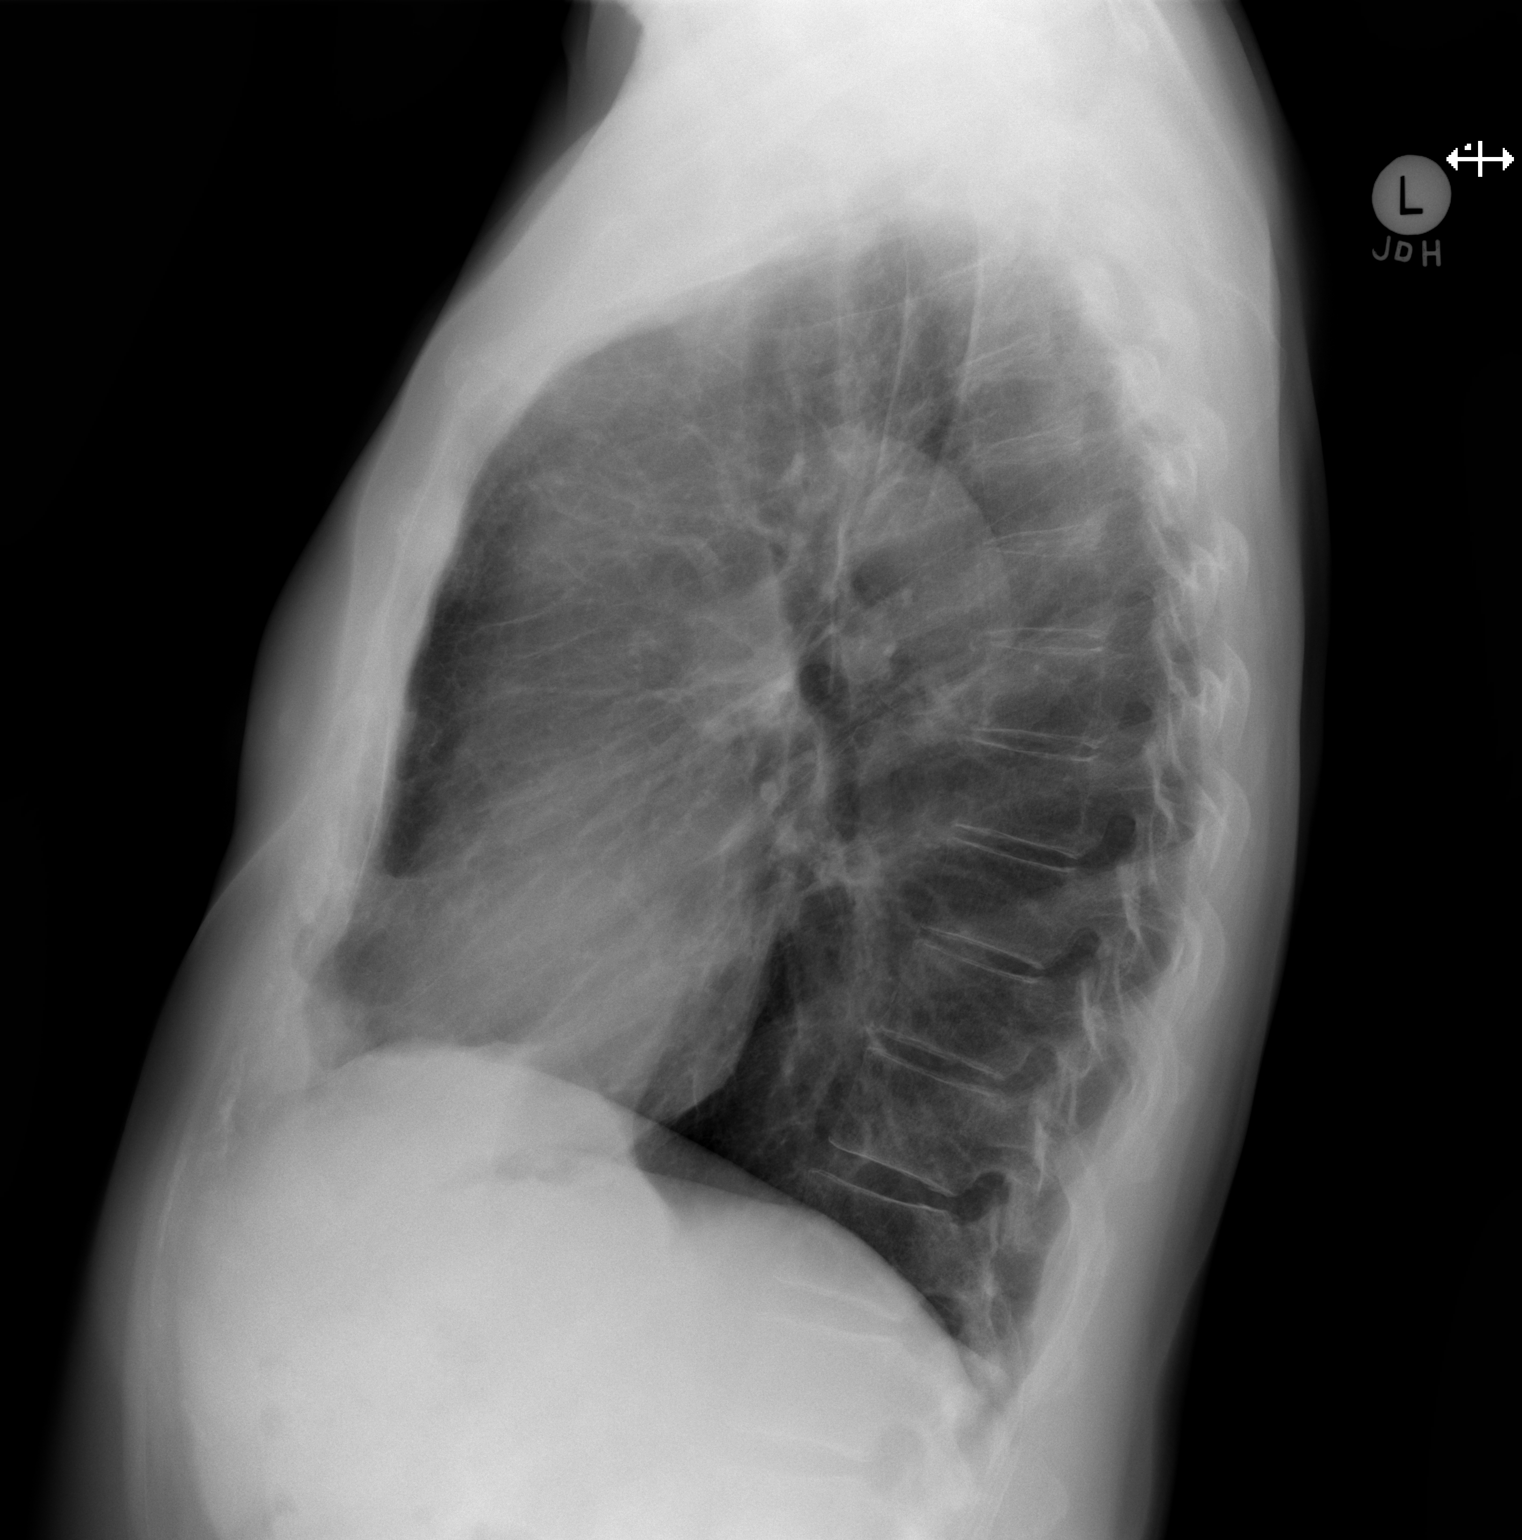

[2 of 2 positions shown; findings below may reference images not displayed]

FINDINGS: The heart size and mediastinal contours are within normal limits.
Mild pulmonary hyperinflation and chronic central peribronchial
thickening are stable. No evidence of pulmonary infiltrate or edema.
No evidence of pleural effusion. The visualized skeletal structures
are unremarkable.
IMPRESSION: Stable exam.  Probable COPD.  No active disease.

## 2016-03-20 ENCOUNTER — Telehealth: Payer: Self-pay

## 2016-03-20 MED ORDER — SYNTHROID 75 MCG PO TABS: 75.0000 ug | ORAL_TABLET | Freq: Every day | ORAL | 3 refills | Status: DC

## 2016-03-20 NOTE — Telephone Encounter (Signed)
Rx refill Synthroid 75 mcg tab 1 po qd from [facility]  Last ov: 12/22/2014 Next appt was on 12/23/2015 appt was canceled Please advise

## 2016-03-20 NOTE — Telephone Encounter (Signed)
Synthroid eRx'd per pt's request.

## 2016-03-20 NOTE — Telephone Encounter (Signed)
Patient requesting refill of synthroid 75 mcg #90.  Last appt cancelled due to provider being out of office.  Patient has rescheduled appt to 03/23/16.  Pharmacy:  [facility], [place] [phone] (Phone) [phone] (Fax)

## 2016-03-20 NOTE — Telephone Encounter (Signed)
lmom letting him know rx was sent to pharmacy

## 2016-03-21 ENCOUNTER — Other Ambulatory Visit: Payer: Self-pay | Admitting: Family Medicine

## 2016-03-22 ENCOUNTER — Other Ambulatory Visit: Payer: Self-pay | Admitting: Family Medicine

## 2016-03-22 MED ORDER — SYNTHROID 75 MCG PO TABS: 75.0000 ug | ORAL_TABLET | Freq: Every day | ORAL | 0 refills | Status: DC

## 2016-03-23 ENCOUNTER — Ambulatory Visit (INDEPENDENT_AMBULATORY_CARE_PROVIDER_SITE_OTHER): Payer: Medicare Other | Admitting: Family Medicine

## 2016-03-23 ENCOUNTER — Encounter: Payer: Self-pay | Admitting: Family Medicine

## 2016-03-23 VITALS — BP 146/92 | HR 56 | Temp 97.9°F | Resp 16 | Ht 74.0 in | Wt 161.1 lb

## 2016-03-23 DIAGNOSIS — Z0000 Encounter for general adult medical examination without abnormal findings: Secondary | ICD-10-CM | POA: Diagnosis not present

## 2016-03-23 DIAGNOSIS — E039 Hypothyroidism, unspecified: Secondary | ICD-10-CM

## 2016-03-23 DIAGNOSIS — Z125 Encounter for screening for malignant neoplasm of prostate: Secondary | ICD-10-CM

## 2016-03-23 DIAGNOSIS — Z23 Encounter for immunization: Secondary | ICD-10-CM | POA: Diagnosis not present

## 2016-03-23 LAB — PSA, MEDICARE: PSA: 1.14 ng/ml (ref 0.10–4.00)

## 2016-03-23 LAB — TSH: TSH: 0.69 u[IU]/mL (ref 0.35–4.50)

## 2016-03-23 NOTE — Progress Notes (Signed)
The patient is here for annual Medicare wellness examination and management of other chronic and acute problems. Other problems discussed today: he has hypothyroidism and is due for TSH monitoring.  Pt is feeling well and has no acute complaints.  Lab Results  Component Value Date   TSH 3.04 12/22/2014   Lab Results  Component Value Date   WBC 7.6 06/02/2014   HGB 14.5 06/02/2014   HCT 44.3 06/02/2014   MCV 93.4 06/02/2014   PLT 359.0 06/02/2014   Lab Results  Component Value Date   CREATININE 0.8 06/02/2014   BUN 21 06/02/2014   NA 140 06/02/2014   K 4.0 06/02/2014   CL 104 06/02/2014   CO2 24 06/02/2014   Lab Results  Component Value Date   ALT 29 06/02/2014   AST 24 06/02/2014   ALKPHOS 77 06/02/2014   BILITOT 1.0 06/02/2014   Lab Results  Component Value Date   CHOL 184 06/02/2014   Lab Results  Component Value Date   HDL 63.00 06/02/2014   Lab Results  Component Value Date   LDLCALC 89 06/02/2014   Lab Results  Component Value Date   TRIG 158.0 (H) 06/02/2014   Lab Results  Component Value Date   CHOLHDL 3 06/02/2014   Lab Results  Component Value Date   PSA 0.99 06/02/2014   PSA 0.90 04/10/2012   PSA 0.93 09/12/2011     AWV DATA The risk factors are reflected in the social history.  The roster of all physicians providing medical care to patient is listed in the Snapshot section of the chart.  Activities of daily living:  The patient is 100% independent in all ADLs: dressing, toileting, feeding as well as independent mobility.  Home safety : The patient has smoke detectors in the home. They wear seatbelts. No firearms at home ( firearms are present in the home, kept in a safe fashion). There is no violence in the home.   There is no risks for hepatitis, STDs or HIV. There is no history of blood transfusion. They have no travel history to infectious disease endemic areas of the world.  The patient has seen their dentist in the last six month.  They have seen their eye doctor in the last year (1 week ago). They deny any hearing difficulty and have not had audiologic testing in the last year.  They do not  have excessive sun exposure. Discussed the need for sun protection: hats, long sleeves and use of sunscreen if there is significant sun exposure.   Diet: the importance of a healthy diet is discussed. They do have a moderately healthy diet.  The patient does not have a regular exercise program.  He feels like he does what he needs to do.  The benefits of regular aerobic exercise were discussed.  Depression screen: there are no signs or vegative symptoms of depression- irritability, change in appetite, anhedonia, sadness/tearfullness. Depression screen PHQ 2/9 12/22/2014  Decreased Interest 0  Down, Depressed, Hopeless 0  PHQ - 2 Score 0   Fall Risk  12/22/2014  Falls in the past year? No     Cognitive assessment: the patient manages all their financial and personal affairs and is actively engaged. They could relate day,date,year and events; recalled 3/3 objects at 3 minutes; performed clock-face test normally.  Reviewed advance directives with pt today.  Pt has these in place.  The following portions of the patient's history were reviewed and updated as appropriate: allergies, current medications, past  family history, past medical history,  past surgical history, past social history  and problem list.  Vision, hearing, body mass index were assessed and reviewed. Body mass index is 20.69 kg/m.  During the course of the visit the patient was educated and counseled about appropriate screening and preventive services including :  Annual wellness visit--doing today. diabetes screening: due for this but pt declines. colorectal cancer screening: UTD.  Next colonoscopy due 2020 (+hx of adenomatous colon polyps). recommended immunizations (influenza, pneumococcal, Hep B): will give pneumovax 23 today.  He'll wait 1 mo to return for flu  vaccine. Bone mass measurement: n/a Counseling to prevent tobacco use: n/a Depression screening: done today. Glaucoma screening: done via his eye MD Hepatitis C virus screening: pt declines HIV virus screening: pt declines. Lung cancer screening: pt doesn't qualify. Medical nutrition therapy: n/a Prostate cancer screening: pt agrees to PSA testing today but deferred DRE to a future appt Screening mammography: n/a Screening pap tests, pelvic exam, and clinical breast exam: n/a Ultrasound screening for AAA: pt doesn't qualify.  A written plan of action regarding the above screening and preventative services was given to the patient today:  Pneumovax 23 given today, PSA level checked.  Regarding pt's hypothyroidism, we drew TSH level today.  An After Visit Summary was printed and given to the patient.  Follow up: 1 yr for f/u hypothyroidism, prostate ca screening.  He has had cholesterol testing most recently in 2015 and these were great.  Pls note some of the preventative info highlighted above.  Signed:  Crissie Sickles, MD           03/23/2016

## 2016-06-16 ENCOUNTER — Other Ambulatory Visit: Payer: Self-pay | Admitting: Family Medicine

## 2016-06-18 NOTE — Telephone Encounter (Signed)
RF request for synthroid LOV: 03/23/16 Next ov: 03/26/17 Last written: 03/22/16 #90 w/ 0RF  03/23/16 TSH 0.69

## 2016-07-02 DIAGNOSIS — J3081 Allergic rhinitis due to animal (cat) (dog) hair and dander: Secondary | ICD-10-CM | POA: Diagnosis not present

## 2016-07-02 DIAGNOSIS — J4540 Moderate persistent asthma, uncomplicated: Secondary | ICD-10-CM | POA: Diagnosis not present

## 2016-07-02 DIAGNOSIS — J301 Allergic rhinitis due to pollen: Secondary | ICD-10-CM | POA: Diagnosis not present

## 2016-07-02 DIAGNOSIS — J339 Nasal polyp, unspecified: Secondary | ICD-10-CM | POA: Diagnosis not present

## 2017-03-13 DIAGNOSIS — J301 Allergic rhinitis due to pollen: Secondary | ICD-10-CM | POA: Diagnosis not present

## 2017-03-13 DIAGNOSIS — J4540 Moderate persistent asthma, uncomplicated: Secondary | ICD-10-CM | POA: Diagnosis not present

## 2017-03-13 DIAGNOSIS — J339 Nasal polyp, unspecified: Secondary | ICD-10-CM | POA: Diagnosis not present

## 2017-03-13 DIAGNOSIS — J3081 Allergic rhinitis due to animal (cat) (dog) hair and dander: Secondary | ICD-10-CM | POA: Diagnosis not present

## 2017-03-26 ENCOUNTER — Encounter: Payer: Medicare Other | Admitting: Family Medicine

## 2017-07-01 ENCOUNTER — Encounter: Payer: Self-pay | Admitting: Family Medicine

## 2017-07-01 ENCOUNTER — Ambulatory Visit (INDEPENDENT_AMBULATORY_CARE_PROVIDER_SITE_OTHER): Payer: Medicare Other | Admitting: Family Medicine

## 2017-07-01 ENCOUNTER — Other Ambulatory Visit: Payer: Self-pay

## 2017-07-01 VITALS — BP 144/86 | HR 74 | Temp 98.0°F | Resp 16 | Ht 74.0 in | Wt 165.0 lb

## 2017-07-01 DIAGNOSIS — R030 Elevated blood-pressure reading, without diagnosis of hypertension: Secondary | ICD-10-CM

## 2017-07-01 DIAGNOSIS — N401 Enlarged prostate with lower urinary tract symptoms: Secondary | ICD-10-CM

## 2017-07-01 DIAGNOSIS — Z0000 Encounter for general adult medical examination without abnormal findings: Secondary | ICD-10-CM

## 2017-07-01 DIAGNOSIS — E039 Hypothyroidism, unspecified: Secondary | ICD-10-CM

## 2017-07-01 DIAGNOSIS — N138 Other obstructive and reflux uropathy: Secondary | ICD-10-CM

## 2017-07-01 DIAGNOSIS — I499 Cardiac arrhythmia, unspecified: Secondary | ICD-10-CM | POA: Diagnosis not present

## 2017-07-01 DIAGNOSIS — Z125 Encounter for screening for malignant neoplasm of prostate: Secondary | ICD-10-CM

## 2017-07-01 DIAGNOSIS — Z8042 Family history of malignant neoplasm of prostate: Secondary | ICD-10-CM | POA: Diagnosis not present

## 2017-07-01 DIAGNOSIS — Z1159 Encounter for screening for other viral diseases: Secondary | ICD-10-CM

## 2017-07-01 LAB — PSA: PSA: 1.27 ng/mL (ref 0.10–4.00)

## 2017-07-01 LAB — TSH: TSH: 4.55 u[IU]/mL — AB (ref 0.35–4.50)

## 2017-07-01 MED ORDER — TAMSULOSIN HCL 0.4 MG PO CAPS: ORAL_CAPSULE | ORAL | 2 refills | Status: DC

## 2017-07-01 NOTE — Progress Notes (Signed)
Subjective:   Benjamin Johns is a 71 y.o. male who presents for Medicare Annual/Subsequent preventive examination.  Review of Systems:  No ROS.  Medicare Wellness Visit. Additional risk factors are reflected in the social history.  Cardiac Risk Factors include: advanced age (>21men, >64 women);male gender   Sleep patterns: Sleeps 6-7 hours.  Home Safety/Smoke Alarms: Feels safe in home. Smoke alarms in place.  Living environment; residence and Firearm Safety: Lives with wife in 3 story home.  Seat Belt Safety/Bike Helmet: Wears seat belt.    Male:   CCS-03/01/2014, polyp. Recall 5 years.     PSA-  Lab Results  Component Value Date   PSA 1.14 03/23/2016   PSA 0.99 06/02/2014   PSA 0.90 04/10/2012       Objective:    Vitals: BP (!) 144/86 (BP Location: Left Arm, Cuff Size: Normal)   Pulse 74   Temp 98 F (36.7 C) (Oral)   Resp 16   Ht 6\' 2"  (1.88 m)   Wt 165 lb (74.8 kg)   SpO2 94%   BMI 21.18 kg/m   Body mass index is 21.18 kg/m.  Advanced Directives 07/01/2017 02/22/2014  Does Patient Have a Medical Advance Directive? Yes Patient has advance directive, copy not in chart  Type of Advance Directive [place];Living will Living will;Healthcare Power of Attorney  Does patient want to make changes to medical advance directive? - No change requested  Copy of [place] in Chart? No - copy requested Copy requested from family  Pre-existing out of facility DNR order (yellow form or pink MOST form) - No    Tobacco Social History   Tobacco Use  Smoking Status Never Smoker  Smokeless Tobacco Never Used     Counseling given: Not Answered     Past Medical History:  Diagnosis Date  . Adenomatous colon polyp 2011;02/2014   2015; six sessile polyps 3-5 mm in size, no high grade dysplasia. Recall 5 yrs (Dr. Hilarie Fredrickson)  . Allergic rhinitis    pollen,dust mites, cat/dog dander [facility] allergy/immunology)  . BPH (benign prostatic  hypertrophy)   . Family history of prostate cancer   . Hypothyroidism   . Moderate persistent asthma    Past Surgical History:  Procedure Laterality Date  . COLONOSCOPY W/ POLYPECTOMY  2011;2015   Recall 5 yrs  . NASAL SINUS SURGERY  03/2014   Dr. Wilburn Cornelia (polyps removed); nasal polyposis, allerg rhin + asthma when taking NSAIDs, question of Samter's triad per Dr. Wilburn Cornelia.  Allergist eval recommended/being pursued as of 10/29/14  . TONSILLECTOMY     Family History  Problem Relation Age of Onset  . Dementia Mother   . Prostate cancer Father   . Hypothyroidism Brother   . Colon cancer Neg Hx   . Esophageal cancer Neg Hx   . Rectal cancer Neg Hx   . Stomach cancer Neg Hx    Social History   Socioeconomic History  . Marital status: Married    Spouse name: None  . Number of children: None  . Years of education: None  . Highest education level: None  Social Needs  . Financial resource strain: None  . Food insecurity - worry: None  . Food insecurity - inability: None  . Transportation needs - medical: None  . Transportation needs - non-medical: None  Occupational History  . None  Tobacco Use  . Smoking status: Never Smoker  . Smokeless tobacco: Never Used  Substance and Sexual Activity  .  Alcohol use: Yes    Alcohol/week: 1.0 oz    Types: 2 drink(s) per week  . Drug use: No  . Sexual activity: None  Other Topics Concern  . None  Social History Narrative   Married, 3 children, 2 grandchildren.   Lives in [place].   Retired Social research officer, government (age 44).   No tob, occ alcohol.         Outpatient Encounter Medications as of 07/01/2017  Medication Sig  . budesonide-formoterol (SYMBICORT) 160-4.5 MCG/ACT inhaler Inhale 2 puffs into the lungs daily.  . fluticasone (FLONASE) 50 MCG/ACT nasal spray   . montelukast (SINGULAIR) 10 MG tablet   . SYNTHROID 75 MCG tablet TAKE ONE TABLET BY MOUTH DAILY  . tamsulosin (FLOMAX) 0.4 MG CAPS capsule 1-2 tabs po qhs  .  [DISCONTINUED] oxybutynin (DITROPAN-XL) 10 MG 24 hr tablet Take 1 tablet (10 mg total) by mouth at bedtime. (Patient not taking: Reported on 07/01/2017)   No facility-administered encounter medications on file as of 07/01/2017.     Activities of Daily Living In your present state of health, do you have any difficulty performing the following activities: 07/01/2017  Hearing? N  Vision? N  Difficulty concentrating or making decisions? N  Walking or climbing stairs? N  Dressing or bathing? N  Doing errands, shopping? N  Preparing Food and eating ? N  Using the Toilet? N  In the past six months, have you accidently leaked urine? N  Do you have problems with loss of bowel control? N  Managing your Medications? N  Managing your Finances? N  Housekeeping or managing your Housekeeping? N  Some recent data might be hidden    Patient Care Team: Tammi Sou, MD as PCP - General (Family Medicine) Jerrell Belfast, MD as Consulting Physician (Otolaryngology) Tiajuana Amass, MD as Referring Physician (Allergy and Immunology)   Assessment:   This is a routine wellness examination for Benjamin Johns.  Exercise Activities and Dietary recommendations Current Exercise Habits: The patient does not participate in regular exercise at present(Stays active), Exercise limited by: None identified   Diet (meal preparation, eat out, water intake, caffeinated beverages, dairy products, fruits and vegetables): Drinks coffee and water.   Eats 2-3 meals/day, attempts heart healthy diet.   Goals    . Patient Stated     Maintain current health by staying active.        Fall Risk Fall Risk  07/01/2017 03/23/2016 12/22/2014  Falls in the past year? Yes No No  Number falls in past yr: 1 - -  Injury with Fall? No - -  Follow up Falls prevention discussed - -    Depression Screen PHQ 2/9 Scores 07/01/2017 03/23/2016 12/22/2014  PHQ - 2 Score 0 0 0    Cognitive Function       Ad8 score reviewed for  issues:  Issues making decisions: no  Less interest in hobbies / activities: no  Repeats questions, stories (family complaining): no  Trouble using ordinary gadgets (microwave, computer, phone): no  Forgets the month or year: no  Mismanaging finances: no  Remembering appts: no  Daily problems with thinking and/or memory: no Ad8 score is=0     Immunization History  Administered Date(s) Administered  . Pneumococcal Conjugate-13 12/22/2014  . Pneumococcal Polysaccharide-23 03/23/2016  . Td 07/16/2005      Screening Tests Health Maintenance  Topic Date Due  . Hepatitis C Screening  [birth year]  . INFLUENZA VACCINE  03/03/2018 (Originally 02/13/2017)  . TETANUS/TDAP  07/01/2018 (Originally  07/17/2015)  . COLONOSCOPY  03/02/2019  . PNA vac Low Risk Adult  Completed      Plan:     Let us know about shingles vaccine. (Shingrix)  Bring a copy of your living will and/or healthcare power of attorney to your next office visit.  Continue doing brain stimulating activities (puzzles, reading, adult coloring books, staying active) to keep memory sharp.   I have personally reviewed and noted the following in the patient's chart:   . Medical and social history . Use of alcohol, tobacco or illicit drugs  . Current medications and supplements . Functional ability and status . Nutritional status . Physical activity . Advanced directives . List of other physicians . Hospitalizations, surgeries, and ER visits in previous 12 months . Vitals . Screenings to include cognitive, depression, and falls . Referrals and appointments  In addition, I have reviewed and discussed with patient certain preventive protocols, quality metrics, and best practice recommendations. A written personalized care plan for preventive services as well as general preventive health recommendations were provided to patient.     Gerilyn Nestle, RN  07/01/2017

## 2017-07-01 NOTE — Progress Notes (Signed)
OFFICE VISIT  07/01/2017   CC:  Chief Complaint  Patient presents with  . Follow-up    [facility], pt is fasitng.    HPI:    Patient is a 71 y.o. Caucasian male who presents for f/u hypothyroidism. He sees Dr. Orvil Feil for asthma and allergic rhinitis.  He tries to f/u 1 time per year with Dr. Orvil Feil.  Hypothyroidism: takes med on empty stomach every morning.  Urinary sx's: chronic mildly poor urinary stream.  Nocturia x 3-4 per night.  No daytime frequency.  No frequency during daytime.  Some sense of incomplete emptying.  Oxybutynin is on his med list but pt denies recall this ever being rx'd for him. Has never been on alpha blocker or other BPH med.  Has no hx of elevated bp's/HTN: does not check BP outside of medical setting. Denies palpitations, dizziness, SOB, CP, or LE swelling.  Past Medical History:  Diagnosis Date  . Adenomatous colon polyp 2011;02/2014   2015; six sessile polyps 3-5 mm in size, no high grade dysplasia. Recall 5 yrs (Dr. Hilarie Fredrickson)  . Allergic rhinitis    pollen,dust mites, cat/dog dander [facility] allergy/immunology)  . BPH (benign prostatic hypertrophy)   . Family history of prostate cancer   . Hypothyroidism   . Moderate persistent asthma     Past Surgical History:  Procedure Laterality Date  . COLONOSCOPY W/ POLYPECTOMY  2011;2015   Recall 5 yrs  . NASAL SINUS SURGERY  03/2014   Dr. Wilburn Cornelia (polyps removed); nasal polyposis, allerg rhin + asthma when taking NSAIDs, question of Samter's triad per Dr. Wilburn Cornelia.  Allergist eval recommended/being pursued as of 10/29/14  . TONSILLECTOMY      Outpatient Medications Prior to Visit  Medication Sig Dispense Refill  . budesonide-formoterol (SYMBICORT) 160-4.5 MCG/ACT inhaler Inhale 2 puffs into the lungs daily.    . fluticasone (FLONASE) 50 MCG/ACT nasal spray   11  . montelukast (SINGULAIR) 10 MG tablet     . SYNTHROID 75 MCG tablet TAKE ONE TABLET BY MOUTH DAILY 90 tablet 3  . oxybutynin (DITROPAN-XL) 10  MG 24 hr tablet Take 1 tablet (10 mg total) by mouth at bedtime. (Patient not taking: Reported on 07/01/2017) 30 tablet 6   No facility-administered medications prior to visit.     Allergies  Allergen Reactions  . Amoxicillin-Pot Clavulanate     REACTION: hives    ROS As per HPI  PE: Blood pressure (!) 144/86, pulse 74, temperature 98 F (36.7 C), temperature source Oral, resp. rate 16, height 6\' 2"  (1.88 m), weight 165 lb (74.8 kg), SpO2 94 %.Repeat BP today 144/86. Gen: Alert, well appearing.  Patient is oriented to person, place, time, and situation. AFFECT: pleasant, lucid thought and speech. CV: some irregularity of rhythm noted--some ectopy superimposed on RR vs irreg rhythm.  No murmur or rub. Chest is clear, no wheezing or rales. Normal symmetric air entry throughout both lung fields. No chest wall deformities or tenderness. EXT: no clubbing, cyanosis, or edema.    LABS:  Lab Results  Component Value Date   TSH 0.69 03/23/2016   Lab Results  Component Value Date   WBC 7.6 06/02/2014   HGB 14.5 06/02/2014   HCT 44.3 06/02/2014   MCV 93.4 06/02/2014   PLT 359.0 06/02/2014   Lab Results  Component Value Date   CREATININE 0.8 06/02/2014   BUN 21 06/02/2014   NA 140 06/02/2014   K 4.0 06/02/2014   CL 104 06/02/2014   CO2 24 06/02/2014  Lab Results  Component Value Date   ALT 29 06/02/2014   AST 24 06/02/2014   ALKPHOS 77 06/02/2014   BILITOT 1.0 06/02/2014   Lab Results  Component Value Date   CHOL 184 06/02/2014   Lab Results  Component Value Date   HDL 63.00 06/02/2014   Lab Results  Component Value Date   LDLCALC 89 06/02/2014   Lab Results  Component Value Date   TRIG 158.0 (H) 06/02/2014   Lab Results  Component Value Date   CHOLHDL 3 06/02/2014   Lab Results  Component Value Date   PSA 1.14 03/23/2016   PSA 0.99 06/02/2014   PSA 0.90 04/10/2012   12 lead EKG today: NSR, rate 62, normal wave morphologies, no ischemic changes, no  ectopy.  Normal intervals and duration.  No prior EKG available for comparison.  IMPRESSION AND PLAN:  1) BPH with LUTS, the most bothersome sx being nocturia: start trial of flomax 0.4mg , 1 tab qhs.  Titrate to 2 qhs after 5 days if not feeling signif improvement.  Therapeutic expectations and side effect profile of medication discussed today.  Patient's questions answered.  2) Elevated bp w/out dx of HTN. Instructions: Buy bp cuff for home use: check bp and heart rate 1 time daily and write these numbers down and we'll review them at next f/u in 2 mo.  3) Hypothyroidism: due for TSH monitoring today.  4) FH prostate ca in Father:  Pt requested PSA screening today (last PSA was 15 mo ago). PSA ordered today.  5) Cardiac rhythm irregularity: only on exam.  EKG normal--NO ectopy. Suspect brief/intermittent ectopy only when I was listening to him.  An After Visit Summary was printed and given to the patient.  FOLLOW UP: Return in about 2 months (around 09/01/2017) for f/u BPH and elev bp .  Signed:  Crissie Sickles, MD           07/01/2017

## 2017-07-01 NOTE — Patient Instructions (Addendum)
Buy bp cuff for home use: check bp and heart rate 1 time daily and write these numbers down and we'll review them at next f/u in 2 mo.  Let us know about shingles vaccine. (Shingrix)  Bring a copy of your living will and/or healthcare power of attorney to your next office visit.  Continue doing brain stimulating activities (puzzles, reading, adult coloring books, staying active) to keep memory sharp.    Health Maintenance, Male A healthy lifestyle and preventive care is important for your health and wellness. Ask your health care provider about what schedule of regular examinations is right for you. What should I know about weight and diet? Eat a Healthy Diet  Eat plenty of vegetables, fruits, whole grains, low-fat dairy products, and lean protein.  Do not eat a lot of foods high in solid fats, added sugars, or salt.  Maintain a Healthy Weight Regular exercise can help you achieve or maintain a healthy weight. You should:  Do at least 150 minutes of exercise each week. The exercise should increase your heart rate and make you sweat (moderate-intensity exercise).  Do strength-training exercises at least twice a week.  Watch Your Levels of Cholesterol and Blood Lipids  Have your blood tested for lipids and cholesterol every 5 years starting at 73 years of age. If you are at high risk for heart disease, you should start having your blood tested when you are 73 years old. You may need to have your cholesterol levels checked more often if: ? Your lipid or cholesterol levels are high. ? You are older than 73 years of age. ? You are at high risk for heart disease.  What should I know about cancer screening? Many types of cancers can be detected early and may often be prevented. Lung Cancer  You should be screened every year for lung cancer if: ? You are a current smoker who has smoked for at least 30 years. ? You are a former smoker who has quit within the past 15 years.  Talk to  your health care provider about your screening options, when you should start screening, and how often you should be screened.  Colorectal Cancer  Routine colorectal cancer screening usually begins at 73 years of age and should be repeated every 5-10 years until you are 73 years old. You may need to be screened more often if early forms of precancerous polyps or small growths are found. Your health care provider may recommend screening at an earlier age if you have risk factors for colon cancer.  Your health care provider may recommend using home test kits to check for hidden blood in the stool.  A small camera at the end of a tube can be used to examine your colon (sigmoidoscopy or colonoscopy). This checks for the earliest forms of colorectal cancer.  Prostate and Testicular Cancer  Depending on your age and overall health, your health care provider may do certain tests to screen for prostate and testicular cancer.  Talk to your health care provider about any symptoms or concerns you have about testicular or prostate cancer.  Skin Cancer  Check your skin from head to toe regularly.  Tell your health care provider about any new moles or changes in moles, especially if: ? There is a change in a mole's size, shape, or color. ? You have a mole that is larger than a pencil eraser.  Always use sunscreen. Apply sunscreen liberally and repeat throughout the day.  Protect yourself by  wearing long sleeves, pants, a wide-brimmed hat, and sunglasses when outside.  What should I know about heart disease, diabetes, and high blood pressure?  If you are 19-58 years of age, have your blood pressure checked every 3-5 years. If you are 57 years of age or older, have your blood pressure checked every year. You should have your blood pressure measured twice-once when you are at a hospital or clinic, and once when you are not at a hospital or clinic. Record the average of the two measurements. To check  your blood pressure when you are not at a hospital or clinic, you can use: ? An automated blood pressure machine at a pharmacy. ? A home blood pressure monitor.  Talk to your health care provider about your target blood pressure.  If you are between 51-39 years old, ask your health care provider if you should take aspirin to prevent heart disease.  Have regular diabetes screenings by checking your fasting blood sugar level. ? If you are at a normal weight and have a low risk for diabetes, have this test once every three years after the age of 73. ? If you are overweight and have a high risk for diabetes, consider being tested at a younger age or more often.  A one-time screening for abdominal aortic aneurysm (AAA) by ultrasound is recommended for men aged 5-75 years who are current or former smokers. What should I know about preventing infection? Hepatitis B If you have a higher risk for hepatitis B, you should be screened for this virus. Talk with your health care provider to find out if you are at risk for hepatitis B infection. Hepatitis C Blood testing is recommended for:  Everyone born from [birth year] through 1967.  Anyone with known risk factors for hepatitis C.  Sexually Transmitted Diseases (STDs)  You should be screened each year for STDs including gonorrhea and chlamydia if: ? You are sexually active and are younger than 73 years of age. ? You are older than 73 years of age and your health care provider tells you that you are at risk for this type of infection. ? Your sexual activity has changed since you were last screened and you are at an increased risk for chlamydia or gonorrhea. Ask your health care provider if you are at risk.  Talk with your health care provider about whether you are at high risk of being infected with HIV. Your health care provider may recommend a prescription medicine to help prevent HIV infection.  What else can I do?  Schedule regular health, dental,  and eye exams.  Stay current with your vaccines (immunizations).  Do not use any tobacco products, such as cigarettes, chewing tobacco, and e-cigarettes. If you need help quitting, ask your health care provider.  Limit alcohol intake to no more than 2 drinks per day. One drink equals 12 ounces of beer, 5 ounces of wine, or 1 ounces of hard liquor.  Do not use street drugs.  Do not share needles.  Ask your health care provider for help if you need support or information about quitting drugs.  Tell your health care provider if you often feel depressed.  Tell your health care provider if you have ever been abused or do not feel safe at home. This information is not intended to replace advice given to you by your health care provider. Make sure you discuss any questions you have with your health care provider. Document Released: 12/29/2007 Document Revised: 02/29/2016 Document  Reviewed: 04/05/2015 Elsevier Interactive Patient Education  [employer].

## 2017-07-02 LAB — HEPATITIS C ANTIBODY
Hepatitis C Ab: NONREACTIVE
SIGNAL TO CUT-OFF: 0.02 (ref <1.00)

## 2017-07-02 NOTE — Progress Notes (Signed)
AWV reviewed and agree.  Signed:  Crissie Sickles, MD           07/02/2017

## 2017-08-02 ENCOUNTER — Other Ambulatory Visit: Payer: Self-pay | Admitting: Family Medicine

## 2017-09-02 ENCOUNTER — Encounter: Payer: Self-pay | Admitting: Family Medicine

## 2017-09-02 ENCOUNTER — Ambulatory Visit (INDEPENDENT_AMBULATORY_CARE_PROVIDER_SITE_OTHER): Payer: Medicare Other | Admitting: Family Medicine

## 2017-09-02 VITALS — BP 135/85 | HR 49 | Temp 97.8°F | Ht 74.0 in | Wt 167.0 lb

## 2017-09-02 DIAGNOSIS — N401 Enlarged prostate with lower urinary tract symptoms: Secondary | ICD-10-CM | POA: Diagnosis not present

## 2017-09-02 DIAGNOSIS — R030 Elevated blood-pressure reading, without diagnosis of hypertension: Secondary | ICD-10-CM

## 2017-09-02 DIAGNOSIS — N138 Other obstructive and reflux uropathy: Secondary | ICD-10-CM

## 2017-09-02 NOTE — Progress Notes (Signed)
OFFICE VISIT  09/02/2017   CC:  Chief Complaint  Patient presents with  . Follow-up    BPH and Elevated Blood pressure   HPI:    Patient is a 72 y.o. Caucasian male who presents for 2 mo f/u elevated bp and BPH.  HTN: 563-875 systolic, then he stopped all his meds for his asthma and bp's improved from 120-130s. Says he feels fine off allergy meds.   Says he has never had to use a rescue inhaler.  He uses symbicort as his rescue.  Says he never liked taking symbicort daily anyway.    Allergic rhinosinusitis: says episodic use of prednisone is the best thing that helps him.  Allergist has   BPH: took flomax 2 tabs qhs for 1 mo but felt no improvement.  Still his only sx is nocturia x 3-4, otherwise no sx's and not bothered by it.  Does not want to try any alternative med at this time.  Past Medical History:  Diagnosis Date  . Adenomatous colon polyp 2011;02/2014   2015; six sessile polyps 3-5 mm in size, no high grade dysplasia. Recall 5 yrs (Dr. Hilarie Fredrickson)  . Allergic rhinitis    pollen,dust mites, cat/dog dander [facility] allergy/immunology)  . BPH (benign prostatic hypertrophy)   . Family history of prostate cancer   . Hypothyroidism   . Moderate persistent asthma     Past Surgical History:  Procedure Laterality Date  . COLONOSCOPY W/ POLYPECTOMY  2011;2015   Recall 5 yrs  . NASAL SINUS SURGERY  03/2014   Dr. Wilburn Cornelia (polyps removed); nasal polyposis, allerg rhin + asthma when taking NSAIDs, question of Samter's triad per Dr. Wilburn Cornelia.  Allergist eval recommended/being pursued as of 10/29/14  . TONSILLECTOMY      Outpatient Medications Prior to Visit  Medication Sig Dispense Refill  . SYNTHROID 75 MCG tablet TAKE ONE TABLET BY MOUTH DAILY 90 tablet 1  . budesonide-formoterol (SYMBICORT) 160-4.5 MCG/ACT inhaler Inhale 2 puffs into the lungs daily.    . fluticasone (FLONASE) 50 MCG/ACT nasal spray   11  . montelukast (SINGULAIR) 10 MG tablet     . tamsulosin (FLOMAX) 0.4  MG CAPS capsule 1-2 tabs po qhs (Patient not taking: Reported on 09/02/2017) 60 capsule 2   No facility-administered medications prior to visit.     Allergies  Allergen Reactions  . Amoxicillin-Pot Clavulanate     REACTION: hives    ROS As per HPI  PE: Blood pressure 135/85, pulse (!) 49, temperature 97.8 F (36.6 C), temperature source Oral, height 6\' 2"  (1.88 m), weight 167 lb (75.8 kg), SpO2 94 %. generally AFFECT: pleasant, lucid thought and speech. CV: RRR, no m/r/g.   LUNGS: CTA bilat, nonlabored resps, good aeration in all lung fields. EXT: no clubbing, cyanosis, or edema.    LABS:    Chemistry      Component Value Date/Time   NA 140 06/02/2014 0904   K 4.0 06/02/2014 0904   CL 104 06/02/2014 0904   CO2 24 06/02/2014 0904   BUN 21 06/02/2014 0904   CREATININE 0.8 06/02/2014 0904      Component Value Date/Time   CALCIUM 9.6 06/02/2014 0904   ALKPHOS 77 06/02/2014 0904   AST 24 06/02/2014 0904   ALT 29 06/02/2014 0904   BILITOT 1.0 06/02/2014 0904     Lab Results  Component Value Date   TSH 4.55 (H) 07/01/2017   Lab Results  Component Value Date   PSA 1.27 07/01/2017   PSA  1.14 03/23/2016   PSA 0.99 06/02/2014   Lab Results  Component Value Date   CHOL 184 06/02/2014   HDL 63.00 06/02/2014   LDLCALC 89 06/02/2014   LDLDIRECT 72.5 09/12/2011   TRIG 158.0 (H) 06/02/2014   CHOLHDL 3 06/02/2014    IMPRESSION AND PLAN:  1) Elevated bp w/out dx HTN. Home monitoring good, esp off allergy meds. Low Na diet, exercise. No meds indicated. Home monitoring to be continued periodically.  2) BPH: only nocturnal sx's.  Just a "nag" to him that he wishes to just continue putting up with, not interested in trial of any other med at this time.  An After Visit Summary was printed and given to the patient.  FOLLOW UP: Return in about 6 months (around 03/02/2018) for routine chronic illness f/u.  Signed:  Crissie Sickles, MD           09/02/2017

## 2017-10-07 DIAGNOSIS — J0100 Acute maxillary sinusitis, unspecified: Secondary | ICD-10-CM | POA: Diagnosis not present

## 2017-10-07 DIAGNOSIS — J0110 Acute frontal sinusitis, unspecified: Secondary | ICD-10-CM | POA: Diagnosis not present

## 2017-10-10 ENCOUNTER — Other Ambulatory Visit: Payer: Self-pay | Admitting: Family Medicine

## 2017-12-02 DIAGNOSIS — J339 Nasal polyp, unspecified: Secondary | ICD-10-CM | POA: Diagnosis not present

## 2017-12-02 DIAGNOSIS — J4540 Moderate persistent asthma, uncomplicated: Secondary | ICD-10-CM | POA: Diagnosis not present

## 2017-12-02 DIAGNOSIS — J0110 Acute frontal sinusitis, unspecified: Secondary | ICD-10-CM | POA: Diagnosis not present

## 2017-12-02 DIAGNOSIS — J301 Allergic rhinitis due to pollen: Secondary | ICD-10-CM | POA: Diagnosis not present

## 2018-02-24 ENCOUNTER — Other Ambulatory Visit: Payer: Self-pay | Admitting: Family Medicine

## 2018-03-19 ENCOUNTER — Encounter: Payer: Self-pay | Admitting: Family Medicine

## 2018-03-19 ENCOUNTER — Ambulatory Visit (INDEPENDENT_AMBULATORY_CARE_PROVIDER_SITE_OTHER): Payer: Medicare Other | Admitting: Family Medicine

## 2018-03-19 VITALS — BP 153/75 | HR 77 | Temp 97.7°F | Resp 16 | Ht 74.0 in | Wt 164.0 lb

## 2018-03-19 DIAGNOSIS — Z13220 Encounter for screening for lipoid disorders: Secondary | ICD-10-CM

## 2018-03-19 DIAGNOSIS — E039 Hypothyroidism, unspecified: Secondary | ICD-10-CM

## 2018-03-19 DIAGNOSIS — Z131 Encounter for screening for diabetes mellitus: Secondary | ICD-10-CM

## 2018-03-19 DIAGNOSIS — Z23 Encounter for immunization: Secondary | ICD-10-CM | POA: Diagnosis not present

## 2018-03-19 LAB — COMPREHENSIVE METABOLIC PANEL
ALK PHOS: 61 U/L (ref 39–117)
ALT: 8 U/L (ref 0–53)
AST: 11 U/L (ref 0–37)
Albumin: 4.1 g/dL (ref 3.5–5.2)
BILIRUBIN TOTAL: 0.8 mg/dL (ref 0.2–1.2)
BUN: 23 mg/dL (ref 6–23)
CO2: 31 mEq/L (ref 19–32)
Calcium: 9.6 mg/dL (ref 8.4–10.5)
Chloride: 105 mEq/L (ref 96–112)
Creatinine, Ser: 0.96 mg/dL (ref 0.40–1.50)
GFR: 81.32 mL/min (ref >60.00)
GLUCOSE: 97 mg/dL (ref 70–99)
Potassium: 4.4 mEq/L (ref 3.5–5.1)
Sodium: 142 mEq/L (ref 135–145)
TOTAL PROTEIN: 6.5 g/dL (ref 6.0–8.3)

## 2018-03-19 LAB — LIPID PANEL
Cholesterol: 167 mg/dL (ref 0–200)
HDL: 59.4 mg/dL (ref >39.00)
LDL Cholesterol: 82 mg/dL (ref 0–99)
NONHDL: 107.36
Total CHOL/HDL Ratio: 3
Triglycerides: 127 mg/dL (ref 0.0–149.0)
VLDL: 25.4 mg/dL (ref 0.0–40.0)

## 2018-03-19 LAB — TSH: TSH: 1.27 u[IU]/mL (ref 0.35–4.50)

## 2018-03-19 MED ORDER — TETANUS-DIPHTH-ACELL PERTUSSIS 5-2.5-18.5 LF-MCG/0.5 IM SUSP: 0.5000 mL | Freq: Once | INTRAMUSCULAR | 0 refills | Status: AC

## 2018-03-19 NOTE — Progress Notes (Signed)
OFFICE VISIT  03/19/2018   CC:  Chief Complaint  Patient presents with  . Follow-up    RCI, pt is fasting.      HPI:    Patient is a 72 y.o. Caucasian male who presents for 6 mo f/u hypothyroidism. Has signif allergic rhinitis and mod pers asthma managed by [facility] allergy/immunology. Has f/u with allergist later this week.  Struggles with allergies/sinusitis: thinking about getting steroid injection at that time.  No acute complaints.  Past Medical History:  Diagnosis Date  . Adenomatous colon polyp 2011;02/2014   2015; six sessile polyps 3-5 mm in size, no high grade dysplasia. Recall 5 yrs (Dr. Hilarie Fredrickson)  . Allergic rhinitis    pollen,dust mites, cat/dog dander [facility] allergy/immunology)  . BPH (benign prostatic hypertrophy) 2018/19   nocturia is only symptom he has: failed flomax trial.  . Family history of prostate cancer   . Hypothyroidism   . Moderate persistent asthma   . White coat syndrome without diagnosis of hypertension     Past Surgical History:  Procedure Laterality Date  . COLONOSCOPY W/ POLYPECTOMY  2011;2015   Recall 5 yrs  . NASAL SINUS SURGERY  03/2014   Dr. Wilburn Cornelia (polyps removed); nasal polyposis, allerg rhin + asthma when taking NSAIDs, question of Samter's triad per Dr. Wilburn Cornelia.  Allergist eval recommended/being pursued as of 10/29/14  . TONSILLECTOMY      Outpatient Medications Prior to Visit  Medication Sig Dispense Refill  . budesonide-formoterol (SYMBICORT) 160-4.5 MCG/ACT inhaler Inhale 2 puffs into the lungs daily.    . fluticasone (FLONASE) 50 MCG/ACT nasal spray Place 1 spray into both nostrils daily as needed.   11  . montelukast (SINGULAIR) 10 MG tablet Take 10 mg by mouth daily as needed.     Marland Kitchen SYNTHROID 75 MCG tablet TAKE ONE TABLET BY MOUTH DAILY 90 tablet 0  . tamsulosin (FLOMAX) 0.4 MG CAPS capsule TAKE 1 TO 2 CAPSULES BY MOUTH EVERY NIGHT AT BEDTIME (Patient not taking: Reported on 03/19/2018) 60 capsule 5   No  facility-administered medications prior to visit.     Allergies  Allergen Reactions  . Amoxicillin-Pot Clavulanate     REACTION: hives    ROS As per HPI  PE: Blood pressure (!) 153/75, pulse 77, temperature 97.7 F (36.5 C), temperature source Oral, resp. rate 16, height 6\' 2"  (1.88 m), weight 164 lb (74.4 kg), SpO2 95 %. Gen: Alert, well appearing.  Patient is oriented to person, place, time, and situation. AFFECT: pleasant, lucid thought and speech. CV: RRR, no m/r/g.   LUNGS: CTA bilat, nonlabored resps, good aeration in all lung fields. EXT: no clubbing or cyanosis.  no edema.    LABS:  Lab Results  Component Value Date   TSH 4.55 (H) 07/01/2017   Lab Results  Component Value Date   WBC 7.6 06/02/2014   HGB 14.5 06/02/2014   HCT 44.3 06/02/2014   MCV 93.4 06/02/2014   PLT 359.0 06/02/2014   Lab Results  Component Value Date   CREATININE 0.8 06/02/2014   BUN 21 06/02/2014   NA 140 06/02/2014   K 4.0 06/02/2014   CL 104 06/02/2014   CO2 24 06/02/2014   Lab Results  Component Value Date   ALT 29 06/02/2014   AST 24 06/02/2014   ALKPHOS 77 06/02/2014   BILITOT 1.0 06/02/2014   Lab Results  Component Value Date   CHOL 184 06/02/2014   Lab Results  Component Value Date   HDL 63.00 06/02/2014  Lab Results  Component Value Date   LDLCALC 89 06/02/2014   Lab Results  Component Value Date   TRIG 158.0 (H) 06/02/2014   Lab Results  Component Value Date   CHOLHDL 3 06/02/2014   Lab Results  Component Value Date   PSA 1.27 07/01/2017   PSA 1.14 03/23/2016   PSA 0.99 06/02/2014    IMPRESSION AND PLAN:  1) Hypothyroidism: taking med correctly every day. TSH monitoring today.  2) Diabetes and hyperlipidemia screening: last screening for these was 2015, also will do these labs for dx of hypothyroidism and elevated bp w/out dx of HTN.  An After Visit Summary was printed and given to the patient.  FOLLOW UP: Return in about 1 year (around  03/20/2019) for annual CPE (fasting).  Signed:  Crissie Sickles, MD           03/19/2018

## 2018-03-24 DIAGNOSIS — J0110 Acute frontal sinusitis, unspecified: Secondary | ICD-10-CM | POA: Diagnosis not present

## 2018-03-24 DIAGNOSIS — J301 Allergic rhinitis due to pollen: Secondary | ICD-10-CM | POA: Diagnosis not present

## 2018-03-24 DIAGNOSIS — J339 Nasal polyp, unspecified: Secondary | ICD-10-CM | POA: Diagnosis not present

## 2018-03-24 DIAGNOSIS — J4540 Moderate persistent asthma, uncomplicated: Secondary | ICD-10-CM | POA: Diagnosis not present

## 2018-04-13 ENCOUNTER — Encounter: Payer: Self-pay | Admitting: Family Medicine

## 2018-06-01 ENCOUNTER — Other Ambulatory Visit: Payer: Self-pay | Admitting: Family Medicine

## 2018-11-12 ENCOUNTER — Other Ambulatory Visit: Payer: Self-pay | Admitting: Family Medicine

## 2018-12-03 DIAGNOSIS — J339 Nasal polyp, unspecified: Secondary | ICD-10-CM | POA: Diagnosis not present

## 2018-12-03 DIAGNOSIS — J301 Allergic rhinitis due to pollen: Secondary | ICD-10-CM | POA: Diagnosis not present

## 2018-12-03 DIAGNOSIS — J0110 Acute frontal sinusitis, unspecified: Secondary | ICD-10-CM | POA: Diagnosis not present

## 2018-12-03 DIAGNOSIS — J4540 Moderate persistent asthma, uncomplicated: Secondary | ICD-10-CM | POA: Diagnosis not present

## 2018-12-29 DIAGNOSIS — H25811 Combined forms of age-related cataract, right eye: Secondary | ICD-10-CM | POA: Diagnosis not present

## 2018-12-29 DIAGNOSIS — H5213 Myopia, bilateral: Secondary | ICD-10-CM | POA: Diagnosis not present

## 2018-12-29 DIAGNOSIS — H52221 Regular astigmatism, right eye: Secondary | ICD-10-CM | POA: Diagnosis not present

## 2018-12-29 DIAGNOSIS — H25812 Combined forms of age-related cataract, left eye: Secondary | ICD-10-CM | POA: Diagnosis not present

## 2019-01-08 DIAGNOSIS — H2512 Age-related nuclear cataract, left eye: Secondary | ICD-10-CM | POA: Diagnosis not present

## 2019-01-08 DIAGNOSIS — H02831 Dermatochalasis of right upper eyelid: Secondary | ICD-10-CM | POA: Diagnosis not present

## 2019-01-08 DIAGNOSIS — H2513 Age-related nuclear cataract, bilateral: Secondary | ICD-10-CM | POA: Diagnosis not present

## 2019-01-08 DIAGNOSIS — H25013 Cortical age-related cataract, bilateral: Secondary | ICD-10-CM | POA: Diagnosis not present

## 2019-01-08 DIAGNOSIS — H18413 Arcus senilis, bilateral: Secondary | ICD-10-CM | POA: Diagnosis not present

## 2019-02-14 ENCOUNTER — Other Ambulatory Visit: Payer: Self-pay | Admitting: Family Medicine

## 2019-02-14 ENCOUNTER — Encounter: Payer: Self-pay | Admitting: Internal Medicine

## 2019-03-02 DIAGNOSIS — H2512 Age-related nuclear cataract, left eye: Secondary | ICD-10-CM | POA: Diagnosis not present

## 2019-03-02 DIAGNOSIS — H52202 Unspecified astigmatism, left eye: Secondary | ICD-10-CM | POA: Diagnosis not present

## 2019-03-02 DIAGNOSIS — H25812 Combined forms of age-related cataract, left eye: Secondary | ICD-10-CM | POA: Diagnosis not present

## 2019-03-03 DIAGNOSIS — H2511 Age-related nuclear cataract, right eye: Secondary | ICD-10-CM | POA: Diagnosis not present

## 2019-03-09 HISTORY — PX: CATARACT EXTRACTION: SUR2

## 2019-03-11 ENCOUNTER — Telehealth: Payer: Self-pay | Admitting: Family Medicine

## 2019-03-11 NOTE — Telephone Encounter (Signed)
Patient advised we do not perform Covid antibody testing at [facility] facilities, voiced understanding.  Patient will wait to have flu shot at his CPE.

## 2019-03-11 NOTE — Telephone Encounter (Signed)
Patient traveled to [place] to convention earlier this year. There were people there from [place]. Patient would like to schedule an COVID antibody test on a Monday. Patient would also like to get his flu shot on the same day. Patient would like to schedule his appointment and his wife's on the same day. See note in her chart.

## 2019-03-31 ENCOUNTER — Encounter: Payer: Medicare Other | Admitting: Family Medicine

## 2019-04-06 DIAGNOSIS — H52201 Unspecified astigmatism, right eye: Secondary | ICD-10-CM | POA: Diagnosis not present

## 2019-04-06 DIAGNOSIS — H25811 Combined forms of age-related cataract, right eye: Secondary | ICD-10-CM | POA: Diagnosis not present

## 2019-04-06 DIAGNOSIS — H2511 Age-related nuclear cataract, right eye: Secondary | ICD-10-CM | POA: Diagnosis not present

## 2019-04-06 HISTORY — PX: CATARACT EXTRACTION: SUR2

## 2019-04-20 ENCOUNTER — Ambulatory Visit (INDEPENDENT_AMBULATORY_CARE_PROVIDER_SITE_OTHER): Payer: Medicare Other | Admitting: Family Medicine

## 2019-04-20 ENCOUNTER — Encounter: Payer: Self-pay | Admitting: Family Medicine

## 2019-04-20 ENCOUNTER — Other Ambulatory Visit: Payer: Self-pay

## 2019-04-20 VITALS — BP 166/88 | HR 50 | Temp 98.7°F | Resp 16 | Ht 74.0 in | Wt 152.2 lb

## 2019-04-20 DIAGNOSIS — Z86010 Personal history of colonic polyps: Secondary | ICD-10-CM

## 2019-04-20 DIAGNOSIS — E039 Hypothyroidism, unspecified: Secondary | ICD-10-CM | POA: Diagnosis not present

## 2019-04-20 DIAGNOSIS — Z23 Encounter for immunization: Secondary | ICD-10-CM

## 2019-04-20 DIAGNOSIS — Z131 Encounter for screening for diabetes mellitus: Secondary | ICD-10-CM | POA: Diagnosis not present

## 2019-04-20 DIAGNOSIS — J0111 Acute recurrent frontal sinusitis: Secondary | ICD-10-CM | POA: Diagnosis not present

## 2019-04-20 DIAGNOSIS — N138 Other obstructive and reflux uropathy: Secondary | ICD-10-CM

## 2019-04-20 DIAGNOSIS — Z125 Encounter for screening for malignant neoplasm of prostate: Secondary | ICD-10-CM

## 2019-04-20 DIAGNOSIS — N401 Enlarged prostate with lower urinary tract symptoms: Secondary | ICD-10-CM

## 2019-04-20 MED ORDER — AZITHROMYCIN 250 MG PO TABS: ORAL_TABLET | ORAL | 0 refills | Status: AC

## 2019-04-20 MED ORDER — PREDNISONE 20 MG PO TABS: ORAL_TABLET | ORAL | 0 refills | Status: AC

## 2019-04-20 NOTE — Addendum Note (Signed)
Addended by: Deveron Furlong D on: 04/20/2019 02:42 PM   Modules accepted: Orders

## 2019-04-20 NOTE — Progress Notes (Signed)
OFFICE VISIT  04/20/2019   CC:  Chief Complaint  Patient presents with  . Annual Exam    pt is fasting   HPI:    Patient is a 73 y.o. Caucasian male who presents for f/u hypothyroidism, elev bp w/out dx of HTN, and BPH with LUTS. He is followed by Dr. Orvil Feil for allergic rhinitis and asthma.  Interim hx: Says he feels good.  No exercise.  Diet is "reasonable". He is working in his [employer] a lot.  BPH: gets up a couple times to urinate.  No daytime frequency or urgency or poor stream.  No sense of incomplete emptying of bladder.  Thyroid: he is taking his thyroid med correctly.  Reports 2 wk hx of nasal congestion/PND, facial pressure, a hint of ST in mornings. No fever or cough or wheezing.  Not improving.  No loss of taste or smell.  No body aches or fatigue.  We discussed preventative health care today as well: due for screening colonoscopy, has FH of prostate ca (last PSA was fine, 2018).  ROS: no CP, no SOB, no wheezing, no cough, no dizziness, no HAs, no rashes, no melena/hematochezia.  No polyuria or polydipsia.  No myalgias or arthralgias.  Past Medical History:  Diagnosis Date  . Adenomatous colon polyp 2011;02/2014   2015; six sessile polyps 3-5 mm in size, no high grade dysplasia. Recall 5 yrs (Dr. Hilarie Fredrickson)  . Allergic rhinitis    pollen,dust mites, cat/dog dander [facility] allergy/immunology)  . BPH (benign prostatic hypertrophy) 2018/19   nocturia is only symptom he has: failed flomax trial.  . Family history of prostate cancer   . Hypothyroidism   . Moderate persistent asthma   . Nasal polyp   . White coat syndrome without diagnosis of hypertension     Past Surgical History:  Procedure Laterality Date  . CATARACT EXTRACTION Right 04/06/2019  . CATARACT EXTRACTION Left 03/09/2019  . COLONOSCOPY W/ POLYPECTOMY  2011;2015   Recall 5 yrs  . NASAL SINUS SURGERY  03/2014   Dr. Wilburn Cornelia (polyps removed); nasal polyposis, allerg rhin + asthma when  taking NSAIDs, question of Samter's triad per Dr. Wilburn Cornelia.  Allergist eval recommended/being pursued as of 10/29/14  . TONSILLECTOMY     Family History  Problem Relation Age of Onset  . Dementia Mother   . Prostate cancer Father   . Hypothyroidism Brother   . Colon cancer Neg Hx   . Esophageal cancer Neg Hx   . Rectal cancer Neg Hx   . Stomach cancer Neg Hx    Social History   Socioeconomic History  . Marital status: Married    Spouse name: Not on file  . Number of children: Not on file  . Years of education: Not on file  . Highest education level: Not on file  Occupational History  . Not on file  Social Needs  . Financial resource strain: Not on file  . Food insecurity    Worry: Not on file    Inability: Not on file  . Transportation needs    Medical: Not on file    Non-medical: Not on file  Tobacco Use  . Smoking status: Never Smoker  . Smokeless tobacco: Never Used  Substance and Sexual Activity  . Alcohol use: Yes    Alcohol/week: 2.0 standard drinks    Types: 2 drink(s) per week  . Drug use: No  . Sexual activity: Not on file  Lifestyle  . Physical activity    Days  per week: Not on file    Minutes per session: Not on file  . Stress: Not on file  Relationships  . Social Herbalist on phone: Not on file    Gets together: Not on file    Attends religious service: Not on file    Active member of club or organization: Not on file    Attends meetings of clubs or organizations: Not on file    Relationship status: Not on file  Other Topics Concern  . Not on file  Social History Narrative   Married, 3 children, 2 grandchildren.   Lives in [place].   Retired Social research officer, government (age 58).   No tob, occ alcohol.         Outpatient Medications Prior to Visit  Medication Sig Dispense Refill  . budesonide-formoterol (SYMBICORT) 160-4.5 MCG/ACT inhaler Inhale 2 puffs into the lungs daily.    . fluticasone (FLONASE) 50 MCG/ACT nasal spray Place 1  spray into both nostrils daily as needed.   11  . montelukast (SINGULAIR) 10 MG tablet Take 10 mg by mouth daily as needed.     Marland Kitchen SYNTHROID 75 MCG tablet TAKE ONE TABLET BY MOUTH DAILY 90 tablet 0   No facility-administered medications prior to visit.     Allergies  Allergen Reactions  . Amoxicillin-Pot Clavulanate     REACTION: hives    ROS As per HPI  PE: Blood pressure (!) 166/88, pulse (!) 50, temperature 98.7 F (37.1 C), temperature source Temporal, resp. rate 16, height 6\' 2"  (1.88 m), weight 152 lb 3.2 oz (69 kg), SpO2 94 %. Gen: Alert, well appearing.  Patient is oriented to person, place, time, and situation. AFFECT: pleasant, lucid thought and speech. Nose: slight clear rhinorrhea and injected mucosa bilat, mild TM dullness L>R, mild TTP in L frontal sinus region.  Throat w/out swelling, erythema, or exudate.  Mild light yellow PND in posterior pharynx noted. CV: RRR, no m/r/g.   LUNGS: CTA bilat, nonlabored resps, good aeration in all lung fields. EXT: no clubbing or cyanosis.  no edema.    LABS:  Lab Results  Component Value Date   TSH 1.27 03/19/2018   Lab Results  Component Value Date   WBC 7.6 06/02/2014   HGB 14.5 06/02/2014   HCT 44.3 06/02/2014   MCV 93.4 06/02/2014   PLT 359.0 06/02/2014   Lab Results  Component Value Date   CREATININE 0.96 03/19/2018   BUN 23 03/19/2018   NA 142 03/19/2018   K 4.4 03/19/2018   CL 105 03/19/2018   CO2 31 03/19/2018   Lab Results  Component Value Date   ALT 8 03/19/2018   AST 11 03/19/2018   ALKPHOS 61 03/19/2018   BILITOT 0.8 03/19/2018   Lab Results  Component Value Date   CHOL 167 03/19/2018   Lab Results  Component Value Date   HDL 59.40 03/19/2018   Lab Results  Component Value Date   LDLCALC 82 03/19/2018   Lab Results  Component Value Date   TRIG 127.0 03/19/2018   Lab Results  Component Value Date   CHOLHDL 3 03/19/2018   Lab Results  Component Value Date   PSA 1.27 07/01/2017    PSA 1.14 03/23/2016   PSA 0.99 06/02/2014   No results found for: HGBA1C   IMPRESSION AND PLAN:  1) Hypothyroidism: TSH monitoring today.  2) Acute frontal sinusitis: prednisone 40mg  qd x 5d, Z pack.  Continue flonase, singulair.  3) BPH with  minimal LUTS (mild nocturia): he is not bothered by this and doesn't want to try any med.  4) Preventative health care: Vaccines: Tdap->we've sent rx to pharmacy.   Flu vaccine->given today.  Shingrix->he declines this. Labs: BMET, TSH, PSA.   Prostate ca screening: FH of prostate ca in father-->he still wants PSA done today. Colon ca screening: due for colonoscopy this year (2020)->he will be scheduling this ---he got recall letter--AFTER he gets back from a trip to [place] to see his daughter and try to sell his home there.  An After Visit Summary was printed and given to the patient.  FOLLOW UP: Return in about 1 year (around 04/19/2020) for routine chronic illness f/u.  Signed:  Crissie Sickles, MD           04/20/2019

## 2019-04-21 LAB — BASIC METABOLIC PANEL
BUN: 14 mg/dL (ref 6–23)
CO2: 26 mEq/L (ref 19–32)
Calcium: 9.6 mg/dL (ref 8.4–10.5)
Chloride: 105 mEq/L (ref 96–112)
Creatinine, Ser: 0.79 mg/dL (ref 0.40–1.50)
GFR: 95.53 mL/min (ref >60.00)
Glucose, Bld: 88 mg/dL (ref 70–99)
Potassium: 4.1 mEq/L (ref 3.5–5.1)
Sodium: 139 mEq/L (ref 135–145)

## 2019-04-21 LAB — TSH: TSH: 2.01 u[IU]/mL (ref 0.35–4.50)

## 2019-04-21 LAB — PSA, MEDICARE: PSA: 1.44 ng/ml (ref 0.10–4.00)

## 2019-06-06 ENCOUNTER — Other Ambulatory Visit: Payer: Self-pay | Admitting: Family Medicine

## 2019-09-21 DIAGNOSIS — J339 Nasal polyp, unspecified: Secondary | ICD-10-CM | POA: Diagnosis not present

## 2019-09-21 DIAGNOSIS — J4540 Moderate persistent asthma, uncomplicated: Secondary | ICD-10-CM | POA: Diagnosis not present

## 2019-09-21 DIAGNOSIS — J3081 Allergic rhinitis due to animal (cat) (dog) hair and dander: Secondary | ICD-10-CM | POA: Diagnosis not present

## 2019-09-21 DIAGNOSIS — J301 Allergic rhinitis due to pollen: Secondary | ICD-10-CM | POA: Diagnosis not present

## 2019-09-22 DIAGNOSIS — Z23 Encounter for immunization: Secondary | ICD-10-CM | POA: Diagnosis not present

## 2019-10-20 DIAGNOSIS — Z23 Encounter for immunization: Secondary | ICD-10-CM | POA: Diagnosis not present

## 2019-11-04 DIAGNOSIS — Z961 Presence of intraocular lens: Secondary | ICD-10-CM | POA: Diagnosis not present

## 2019-11-04 DIAGNOSIS — H43813 Vitreous degeneration, bilateral: Secondary | ICD-10-CM | POA: Diagnosis not present

## 2020-01-27 DIAGNOSIS — J301 Allergic rhinitis due to pollen: Secondary | ICD-10-CM | POA: Diagnosis not present

## 2020-01-27 DIAGNOSIS — J342 Deviated nasal septum: Secondary | ICD-10-CM | POA: Diagnosis not present

## 2020-02-09 DIAGNOSIS — J329 Chronic sinusitis, unspecified: Secondary | ICD-10-CM | POA: Diagnosis not present

## 2020-02-09 DIAGNOSIS — J339 Nasal polyp, unspecified: Secondary | ICD-10-CM | POA: Diagnosis not present

## 2020-02-23 DIAGNOSIS — J301 Allergic rhinitis due to pollen: Secondary | ICD-10-CM | POA: Diagnosis not present

## 2020-04-15 DIAGNOSIS — J4540 Moderate persistent asthma, uncomplicated: Secondary | ICD-10-CM | POA: Diagnosis not present

## 2020-06-06 DIAGNOSIS — J301 Allergic rhinitis due to pollen: Secondary | ICD-10-CM | POA: Diagnosis not present

## 2020-06-21 DIAGNOSIS — J301 Allergic rhinitis due to pollen: Secondary | ICD-10-CM | POA: Diagnosis not present

## 2020-06-26 DIAGNOSIS — Z23 Encounter for immunization: Secondary | ICD-10-CM | POA: Diagnosis not present

## 2020-06-27 ENCOUNTER — Telehealth: Payer: Self-pay

## 2020-06-27 MED ORDER — SYNTHROID 75 MCG PO TABS: 75.0000 ug | ORAL_TABLET | Freq: Every day | ORAL | 0 refills | Status: AC

## 2020-06-27 NOTE — Telephone Encounter (Signed)
OK to sent 30d supply to pharmacy pt requests in [place], no additional RFs.-thx

## 2020-06-27 NOTE — Telephone Encounter (Signed)
Per protocol: pt has not been seen in over a year please advise

## 2020-06-27 NOTE — Telephone Encounter (Signed)
Patient recently has moved to [place].  He has yet to find a PCP.  He is requesting refill on med.   SYNTHROID 75 MCG tablet [[id number]]    Please send to pharmacy located in [place].  [facility] Store # [id number] [address], [place] Phone [phone]     Please follow up with patient if approved or not approved.  [phone]  Thank you

## 2020-06-27 NOTE — Telephone Encounter (Signed)
Informed pt rx was sent and will need to find a PCP for further refills

## 2020-07-24 ENCOUNTER — Other Ambulatory Visit: Payer: Self-pay | Admitting: Family Medicine

## 2024-02-20 ENCOUNTER — Telehealth: Payer: Self-pay

## 2024-02-24 NOTE — Telephone Encounter (Signed)
 Patient daughter called today and patient is currently being treated in the ICU at [facility]
